# Patient Record
Sex: Female | Born: 1990 | Race: Black or African American | Hispanic: No | Smoking: Never smoker
Health system: Southern US, Community
[De-identification: ages and names within clinical notes are randomized; demographics above are authoritative.]

## PROBLEM LIST (undated history)

## (undated) ENCOUNTER — Inpatient Hospital Stay (HOSPITAL_COMMUNITY): Payer: Self-pay

## (undated) DIAGNOSIS — R0602 Shortness of breath: Secondary | ICD-10-CM

## (undated) DIAGNOSIS — Q513 Bicornate uterus: Secondary | ICD-10-CM

## (undated) DIAGNOSIS — R002 Palpitations: Secondary | ICD-10-CM

## (undated) DIAGNOSIS — N12 Tubulo-interstitial nephritis, not specified as acute or chronic: Secondary | ICD-10-CM

## (undated) DIAGNOSIS — Z87898 Personal history of other specified conditions: Secondary | ICD-10-CM

## (undated) HISTORY — DX: Palpitations: R00.2

## (undated) HISTORY — DX: Personal history of other specified conditions: Z87.898

## (undated) HISTORY — DX: Bicornate uterus: Q51.3

## (undated) HISTORY — PX: NO PAST SURGERIES: SHX2092

## (undated) HISTORY — DX: Shortness of breath: R06.02

---
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
## 2012-06-16 ENCOUNTER — Ambulatory Visit (INDEPENDENT_AMBULATORY_CARE_PROVIDER_SITE_OTHER): Payer: Managed Care, Other (non HMO) | Admitting: Women's Health

## 2012-06-16 ENCOUNTER — Other Ambulatory Visit (HOSPITAL_COMMUNITY)
Admission: RE | Admit: 2012-06-16 | Discharge: 2012-06-16 | Disposition: A | Discharge status: Home / Self Care | Payer: Managed Care, Other (non HMO) | Source: Ambulatory Visit | Attending: Women's Health | Admitting: Women's Health

## 2012-06-16 ENCOUNTER — Encounter: Payer: Self-pay | Admitting: Women's Health

## 2012-06-16 VITALS — BP 96/60 | Ht 68.0 in | Wt 133.0 lb

## 2012-06-16 DIAGNOSIS — Z01419 Encounter for gynecological examination (general) (routine) without abnormal findings: Secondary | ICD-10-CM

## 2012-06-16 DIAGNOSIS — Z309 Encounter for contraceptive management, unspecified: Secondary | ICD-10-CM

## 2012-06-16 DIAGNOSIS — IMO0001 Reserved for inherently not codable concepts without codable children: Secondary | ICD-10-CM

## 2012-06-16 DIAGNOSIS — Z113 Encounter for screening for infections with a predominantly sexual mode of transmission: Secondary | ICD-10-CM

## 2012-06-16 DIAGNOSIS — R87810 Cervical high risk human papillomavirus (HPV) DNA test positive: Secondary | ICD-10-CM | POA: Insufficient documentation

## 2012-06-16 DIAGNOSIS — Z23 Encounter for immunization: Secondary | ICD-10-CM

## 2012-06-16 DIAGNOSIS — Z1151 Encounter for screening for human papillomavirus (HPV): Secondary | ICD-10-CM | POA: Insufficient documentation

## 2012-06-16 LAB — CBC WITH DIFFERENTIAL/PLATELET
Basophils Relative: 0 % (ref 0–1)
Eosinophils Absolute: 0.1 10*3/uL (ref 0.0–0.7)
Eosinophils Relative: 1 % (ref 0–5)
Hemoglobin: 12.7 g/dL (ref 12.0–15.0)
Lymphs Abs: 1.8 10*3/uL (ref 0.7–4.0)
MCH: 24 pg — ABNORMAL LOW (ref 26.0–34.0)
MCHC: 32.5 g/dL (ref 30.0–36.0)
MCV: 73.9 fL — ABNORMAL LOW (ref 78.0–100.0)
Monocytes Relative: 7 % (ref 3–12)
RBC: 5.29 MIL/uL — ABNORMAL HIGH (ref 3.87–5.11)

## 2012-06-16 MED ORDER — LEVONORGEST-ETH ESTRAD 91-DAY 0.15-0.03 &0.01 MG PO TABS: 1.0000 | ORAL_TABLET | Freq: Every day | ORAL | Status: DC

## 2012-06-16 NOTE — Progress Notes (Signed)
Melody Mcclure [birth year] [id number]    History:    The patient presents for annual exam. Contraceptives on Seasonique without complaint. New partner in past year, history of a negative STD screen. Without GI, urinary, vaginal discharge complaints.  Received 1 Gardasil years ago, restart Gardasil series today.  Past medical history, past surgical history, family history and social history were all reviewed and documented in the EPIC chart. 10 lb weight gain in last year. 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 Family Hx includes HTN MA. Social drinking, denies tobacco or illicit drug use.   ROS:  A  ROS was performed and pertinent positives and negatives are included in the history.   Exam:  Filed Vitals:   06/16/12 0956  BP: 96/60    General appearance:  Normal Head/Neck:  Normal, without cervical or supraclavicular adenopathy. Thyroid:  Symmetrical, normal in size, without palpable masses or nodularity. Respiratory  Effort:  Normal  Auscultation:  Clear without wheezing or rhonchi Cardiovascular  Auscultation:  Regular rate, without rubs, murmurs or gallops  Edema/varicosities:  Not grossly evident Abdominal  Soft,nontender, without masses, guarding or rebound.  Liver/spleen:  No organomegaly noted  Hernia:  None appreciated  Skin  Inspection:  Grossly normal  Palpation:  Grossly normal Neurologic/psychiatric  Orientation:  Normal with appropriate conversation.  Mood/affect:  Normal  Genitourinary    Breasts: Examined lying and sitting.     Right: Without masses, retractions, discharge or axillary adenopathy.     Left: Without masses, retractions, discharge or axillary adenopathy.   Inguinal/mons:  Normal without inguinal adenopathy  External genitalia:  Normal  BUS/Urethra/Skene's glands:  Normal  Bladder:  Normal  Vagina:  Normal  Cervix:  Normal  Uterus:   normal in size, shape and contour.  Midline and mobile  Adnexa/parametria:      Rt: Without masses or tenderness.   Lt: Without masses or tenderness.  Anus and perineum: Normal    Assessment/Plan:  22 y.o. SBF G0  for annual exam without complaint.  Normal GYN exam on Seasonique STD screen  Plan: Prescription Seasonique, discussed risk of blood clots and stroke. Encouraged use of condoms until permanent partner. CBC, GC/Chlamydia, PAP,  review of new guidelines. Discussed importance of SBE's, healthy diet, exercise and daily multivitamin. Driving and campus safety reviewed. Gardisal series restarted, instructed to return in March and August to complete series. Will have HIV, hepatitis and RPR checked at student health.    Harrington Challenger [facility], 10:41 AM 06/16/2012

## 2012-06-16 NOTE — Addendum Note (Signed)
Addended by: Richardson Chiquito on: 06/16/2012 11:21 AM   Modules accepted: Orders

## 2012-06-16 NOTE — Patient Instructions (Signed)
Health Maintenance, 22- to 22-Year-Old SCHOOL PERFORMANCE After high school completion, the Libby Goehring adult may be attending college, technical or vocational school, or entering the military or the work force. SOCIAL AND EMOTIONAL DEVELOPMENT The Rosette Bellavance adult establishes adult relationships and explores sexual identity. Evalee Gerard adults may be living at home or in a college dorm or apartment. Increasing independence is important with Majed Pellegrin adults. Throughout adolescence, teens should assume responsibility of their own health care. IMMUNIZATIONS Most Nehal Shives adults should be fully vaccinated. A booster dose of Tdap (tetanus, diphtheria, and pertussis, or "whooping cough"), a dose of meningococcal vaccine to protect against a certain type of bacterial meningitis, hepatitis A, human papillomarvirus (HPV), chickenpox, or measles vaccines may be indicated, if not given at an earlier age. Annual influenza or "flu" vaccination should be considered during flu season.   TESTING Annual screening for vision and hearing problems is recommended. Vision should be screened objectively at least once between 22 and 22 years of age. The Zamani Crocker adult may be screened for anemia or tuberculosis. Ferrel Simington adults should have a blood test to check for high cholesterol during this time period. Jhaniya Briski adults should be screened for use of alcohol and drugs. If the Lynleigh Kovack adult is sexually active, screening for sexually transmitted infections, pregnancy, or HIV may be performed. Screening for cervical cancer should be performed within 3 years of beginning sexual activity. NUTRITION AND ORAL HEALTH  Adequate calcium intake is important. Consume 3 servings of low-fat milk and dairy products daily. For those who do not drink milk or consume dairy products, calcium enriched foods, such as juice, bread, or cereal, dark, leafy greens, or canned fish are alternate sources of calcium.   Drink plenty of water. Limit fruit juice to 8 to 12 ounces per day.  Avoid sugary beverages or sodas.   Discourage skipping meals, especially breakfast. Teens should eat a good variety of vegetables and fruits, as well as lean meats.   Avoid high fat, high salt, and high sugar foods, such as candy, chips, and cookies.   Encourage Mathan Darroch adults to participate in meal planning and preparation.   Eat meals together as a family whenever possible. Encourage conversation at mealtime.   Limit fast food choices and eating out at restaurants.   Brush teeth twice a day and floss.   Schedule dental exams twice a year.  SLEEP Regular sleep habits are important. PHYSICAL, SOCIAL, AND EMOTIONAL DEVELOPMENT  One hour of regular physical activity daily is recommended. Continue to participate in sports.   Encourage Zyah Gomm adults to develop their own interests and consider community service or volunteerism.   Provide guidance to the Vamsi Apfel adult in making decisions about college and work plans.   Make sure that Kourtney Terriquez adults know that they should never be in a situation that makes them uncomfortable, and they should tell partners if they do not want to engage in sexual activity.   Talk to the Maryem Shuffler adult about body image. Eating disorders may be noted at this time. Fran Mcree adults may also be concerned about being overweight. Monitor the Deryck Hippler adult for weight gain or loss.   Mood disturbances, depression, anxiety, alcoholism, or attention problems may be noted in Jamilex Bohnsack adults. Talk to the caregiver if there are concerns about mental illness.   Negotiate limit setting and independent decision making.   Encourage the Lenell Lama adult to handle conflict without physical violence.   Avoid loud noises which may impair hearing.   Limit television and computer time to 2 hours per   day. Individuals who engage in excessive sedentary activity are more likely to become overweight.  RISK BEHAVIORS  Sexually active Phoenix Dresser adults need to take precautions against pregnancy and sexually  transmitted infections. Talk to Ciel Chervenak adults about contraception.   Provide a tobacco-free and drug-free environment for the Cela Newcom adult. Talk to the Inocente Krach adult about drug, tobacco, and alcohol use among friends or at friends' homes. Make sure the Aluna Whiston adult knows that smoking tobacco or marijuana and taking drugs have health consequences and may impact brain development.   Teach the Lillien Petronio adult about appropriate use of over-the-counter or prescription medicines.   Establish guidelines for driving and for riding with friends.   Talk to Gregori Abril adults about the risks of drinking and driving or boating. Encourage the Jyllian Haynie adult to call you if he or she or friends have been drinking or using drugs.   Remind Chett Taniguchi adults to wear seat belts at all times in cars and life vests in boats.   Dyllan Kats adults should always wear a properly fitted helmet when they are riding a bicycle.   Use caution with all-terrain vehicles (ATVs) or other motorized vehicles.   Do not keep handguns in the home. (If you do, the gun and ammunition should be locked separately and out of the Shary Lamos adult's access.)   Equip your home with smoke detectors and change the batteries regularly. Make sure all family members know the fire escape plans for your home.   Teach Aahana Elza adults not to swim alone and not to dive in shallow water.   All individuals should wear sunscreen that protects against UVA and UVB light with at least a sun protection factor (SPF) of 30 when out in the sun. This minimizes sun burning.  WHAT'S NEXT? Gaelan Glennon adults should visit their pediatrician or family physician yearly. By Bethann Qualley adulthood, health care should be transitioned to a family physician or internal medicine specialist. Sexually active females may want to begin annual physical exams with a gynecologist. Document Released: 07/23/2006 Document Revised: 07/20/2011 Document Reviewed: 08/12/2006 ExitCare Patient Information 2013 [employer].    

## 2012-06-17 LAB — GC/CHLAMYDIA PROBE AMP
CT Probe RNA: NEGATIVE
GC Probe RNA: NEGATIVE

## 2012-08-23 ENCOUNTER — Ambulatory Visit: Payer: Managed Care, Other (non HMO)

## 2013-06-19 ENCOUNTER — Encounter: Payer: Managed Care, Other (non HMO) | Admitting: Women's Health

## 2013-06-27 ENCOUNTER — Encounter: Payer: Self-pay | Admitting: Women's Health

## 2013-08-04 ENCOUNTER — Ambulatory Visit (INDEPENDENT_AMBULATORY_CARE_PROVIDER_SITE_OTHER): Payer: BC Managed Care – PPO | Admitting: Cardiology

## 2013-08-04 ENCOUNTER — Encounter: Payer: Self-pay | Admitting: *Deleted

## 2013-08-04 VITALS — BP 124/66 | HR 80 | Ht 68.0 in | Wt 133.0 lb

## 2013-08-04 DIAGNOSIS — R55 Syncope and collapse: Secondary | ICD-10-CM

## 2013-08-04 DIAGNOSIS — R0989 Other specified symptoms and signs involving the circulatory and respiratory systems: Secondary | ICD-10-CM

## 2013-08-04 DIAGNOSIS — R0609 Other forms of dyspnea: Secondary | ICD-10-CM

## 2013-08-04 DIAGNOSIS — R002 Palpitations: Secondary | ICD-10-CM

## 2013-08-04 DIAGNOSIS — R0600 Dyspnea, unspecified: Secondary | ICD-10-CM

## 2013-08-04 NOTE — Patient Instructions (Addendum)
Your physician recommends that you continue on your current medications as directed. Please refer to the Current Medication list given to you today.  Your physician has requested that you have an echocardiogram. Echocardiography is a painless test that uses sound waves to create images of your heart. It provides your doctor with information about the size and shape of your heart and how well your heart's chambers and valves are working. This procedure takes approximately one hour. There are no restrictions for this procedure.  Your physician has recommended that you wear a 24 holter monitor. Holter monitors are medical devices that record the heart's electrical activity. Doctors most often use these monitors to diagnose arrhythmias. Arrhythmias are problems with the speed or rhythm of the heartbeat. The monitor is a small, portable device. You can wear one while you do your normal daily activities. This is usually used to diagnose what is causing palpitations/syncope (passing out).  Your physician recommends that you schedule a follow-up as needed.  Vasovagal Syncope, Adult Syncope, commonly known as fainting, is a temporary loss of consciousness. It occurs when the blood flow to the brain is reduced. Vasovagal syncope (also called neurocardiogenic syncope) is a fainting spell in which the blood flow to the brain is reduced because of a sudden drop in heart rate and blood pressure. Vasovagal syncope occurs when the brain and the cardiovascular system (blood vessels) do not adequately communicate and respond to each other. This is the most common cause of fainting. It often occurs in response to fear or some other type of emotional or physical stress. The body has a reaction in which the heart starts beating too slowly or the blood vessels expand, reducing blood pressure. This type of fainting spell is generally considered harmless. However, injuries can occur if a person takes a sudden fall during a  fainting spell.  CAUSES  Vasovagal syncope occurs when a person's blood pressure and heart rate decrease suddenly, usually in response to a trigger. Many things and situations can trigger an episode. Some of these include:   Pain.   Fear.   The sight of blood or medical procedures, such as blood being drawn from a vein.   Common activities, such as coughing, swallowing, stretching, or going to the bathroom.   Emotional stress.   Prolonged standing, especially in a warm environment.   Lack of sleep or rest.   Prolonged lack of food.   Prolonged lack of fluids.   Recent illness.  The use of certain drugs that affect blood pressure, such as cocaine, alcohol, marijuana, inhalants, and opiates.  SYMPTOMS  Before the fainting episode, you may:   Feel dizzy or light headed.   Become pale.  Sense that you are going to faint.   Feel like the room is spinning.   Have tunnel vision, only seeing directly in front of you.   Feel sick to your stomach (nauseous).   See spots or slowly lose vision.   Hear ringing in your ears.   Have a headache.   Feel warm and sweaty.   Feel a sensation of pins and needles. During the fainting spell, you will generally be unconscious for no longer than a couple minutes before waking up and returning to normal. If you get up too quickly before your body can recover, you may faint again. Some twitching or jerky movements may occur during the fainting spell.  DIAGNOSIS  Your caregiver will ask about your symptoms, take a medical history, and perform a physical  exam. Various tests may be done to rule out other causes of fainting. These may include blood tests and tests to check the heart, such as electrocardiography, echocardiography, and possibly an electrophysiology study. When other causes have been ruled out, a test may be done to check the body's response to changes in position (tilt table test). TREATMENT  Most cases of  vasovagal syncope do not require treatment. Your caregiver may recommend ways to avoid fainting triggers and may provide home strategies for preventing fainting. If you must be exposed to a possible trigger, you can drink additional fluids to help reduce your chances of having an episode of vasovagal syncope. If you have warning signs of an oncoming episode, you can respond by positioning yourself favorably (lying down). If your fainting spells continue, you may be given medicines to prevent fainting. Some medicines may help make you more resistant to repeated episodes of vasovagal syncope. Special exercises or compression stockings may be recommended. In rare cases, the surgical placement of a pacemaker is considered. HOME CARE INSTRUCTIONS   Learn to identify the warning signs of vasovagal syncope.   Sit or lie down at the first warning sign of a fainting spell. If sitting, put your head down between your legs. If you lie down, swing your legs up in the air to increase blood flow to the brain.   Avoid hot tubs and saunas.  Avoid prolonged standing.  Drink enough fluids to keep your urine clear or pale yellow. Avoid caffeine.  Increase salt in your diet as directed by your caregiver.   If you have to stand for a long time, perform movements such as:   Crossing your legs.   Flexing and stretching your leg muscles.   Squatting.   Moving your legs.   Bending over.   Only take over-the-counter or prescription medicines as directed by your caregiver. Do not suddenly stop any medicines without asking your caregiver first. SEEK MEDICAL CARE IF:   Your fainting spells continue or happen more frequently in spite of treatment.   You lose consciousness for more than a couple minutes.  You have fainting spells during or after exercising or after being startled.   You have new symptoms that occur with the fainting spells, such as:   Shortness of breath.  Chest pain.    Irregular heartbeat.   You have episodes of twitching or jerky movements that last longer than a few seconds.  You have episodes of twitching or jerky movements without obvious fainting. SEEK IMMEDIATE MEDICAL CARE IF:   You have injuries or bleeding after a fainting spell.   You have episodes of twitching or jerky movements that last longer than 5 minutes.   You have more than one spell of twitching or jerky movements before returning to consciousness after fainting. MAKE SURE YOU:   Understand these instructions.  Will watch your condition.  Will get help right away if you are not doing well or get worse. Document Released: 04/13/2012 Document Reviewed: 04/13/2012 [facility] Patient Information 2014 [place], [place].

## 2013-08-04 NOTE — Progress Notes (Signed)
[address] [place]  [id number] Phone: [phone] Fax:  [phone]  Date:  08/04/2013   ID:  Melody Mcclure, DOB November 22, 1990, MRN [id number]  PCP:  Default, Provider, MD   History of Present Illness: Melody Kidlesha Marczak is a 23 y.o. female Here for evaluation of palpitations, dyspnea and syncope. She was recently seen by Dr. Hyman HopesWebb on 07/17/13 for a new visit and she described a sensation like her heart is going to raise out of her chest. She felt shaky when this happened. No significant anxiety. She does remember that in high school she had episodes of syncope at times. Her last episode was approximately one year ago. This occurred when she was at a bar. She was not drinking or using any drugs. She passed out approximately 3 times. She felt too tired to chew gum. Blood pressure at that visit was 109/69 with a pulse of 101. Blood work was performed including hemoglobin was 12.6, TSH was 0.86, normal, creatinine 0.9, potassium 3.8, liver functions normal. EKG showed sinus rhythm, possible left atrial enlargement.  Feels pounding at times during the day. She feels her heart racing at times. May feel some slight shortness of breath with this.    Wt Readings from Last 3 Encounters:  08/04/13 133 lb (60.328 kg)  06/16/12 133 lb (60.328 kg)     Past Medical History  Diagnosis Date  . History of syncope     last episode 2014  . Palpitations     Past Surgical History  Procedure Laterality Date  . No past surgeries      No current outpatient prescriptions on file.   No current facility-administered medications for this visit.    Allergies:   No Known Allergies  Social History:  The patient  reports that she has never smoked. She has never used smokeless tobacco. She reports that she drinks alcohol. She reports that she does not use illicit drugs. student at [employer]  Family History  Problem Relation Age of Onset  . Asthma Brother   No early family history  of cardiovascular disease, sudden death  ROS:  Please see the history of present illness.   Denies any vomiting, chest pain, headache, diarrhea, blood loss, fevers, chills, rashes.   All other systems reviewed and negative.   PHYSICAL EXAM: VS:  BP 124/66  Pulse 80  Ht 5\' 8"  (1.727 m)  Wt 133 lb (60.328 kg)  BMI 20.23 kg/m2 Well nourished, well developed, in no acute distress HEENT: normal, [place]/AT, EOMI Neck: no JVD, normal carotid upstroke, no bruit Cardiac:  normal S1, S2; RRR; no murmur Lungs:  clear to auscultation bilaterally, no wheezing, rhonchi or rales Abd: soft, nontender, no hepatomegaly, no bruits Ext: no edema, 2+ distal pulses Skin: warm and dry GU: deferred Neuro: no focal abnormalities noted, AAO x 3  EKG:  07/17/13-sinus rhythm, possible left atrial enlargement, no ST segment changes, no abnormal intervals.  ASSESSMENT AND PLAN:  1. Syncope-she has not had an episode in a few months. These sound like vasovagal episodes. Continue to maintain hydration. Liberalize salt if necessary. She does not have any palpitations around these episodes. Diaphoresis. Clamminess. She has a classic prodrome.I will check an echocardiogram to ensure that she does not have any abnormalities. Luckly, usually this dissipates with age. 2. Palpitations-the "thump" sensation likely PVCs or PACs. Occasionally however she will have a racing sensation. She may feel shortness of breath at times. I will check  a 24-hour Holter monitor to ensure that she does not have any adverse arrhythmias. She's not interested in medications and this is fine. 3. We'll followup with monitor and echocardiogram. She can call me if symptoms worsen.  Signed, Donato Schultz, MD [facility]  08/04/2013 2:04 PM

## 2013-08-09 ENCOUNTER — Ambulatory Visit (HOSPITAL_COMMUNITY): Payer: Managed Care, Other (non HMO) | Attending: Cardiology | Admitting: Radiology

## 2013-08-09 ENCOUNTER — Encounter: Payer: Self-pay | Admitting: *Deleted

## 2013-08-09 ENCOUNTER — Encounter (INDEPENDENT_AMBULATORY_CARE_PROVIDER_SITE_OTHER): Payer: BC Managed Care – PPO

## 2013-08-09 DIAGNOSIS — R55 Syncope and collapse: Secondary | ICD-10-CM | POA: Insufficient documentation

## 2013-08-09 DIAGNOSIS — R002 Palpitations: Secondary | ICD-10-CM

## 2013-08-09 NOTE — Progress Notes (Signed)
Echocardiogram performed.  

## 2013-08-09 NOTE — Progress Notes (Signed)
Patient ID: Melody Mcclure, female   DOB: [birth year], 23 y.o.   MRN: [id number] E-Cardio 24 hour holter monitor applied to patient.

## 2013-08-18 ENCOUNTER — Other Ambulatory Visit (HOSPITAL_COMMUNITY)
Admission: RE | Admit: 2013-08-18 | Discharge: 2013-08-18 | Disposition: A | Discharge status: Home / Self Care | Payer: BC Managed Care – PPO | Source: Ambulatory Visit | Attending: Gynecology | Admitting: Gynecology

## 2013-08-18 ENCOUNTER — Ambulatory Visit (INDEPENDENT_AMBULATORY_CARE_PROVIDER_SITE_OTHER): Payer: Managed Care, Other (non HMO) | Admitting: Women's Health

## 2013-08-18 ENCOUNTER — Encounter: Payer: Self-pay | Admitting: Women's Health

## 2013-08-18 VITALS — BP 110/70 | Ht 67.0 in | Wt 127.0 lb

## 2013-08-18 DIAGNOSIS — Z309 Encounter for contraceptive management, unspecified: Secondary | ICD-10-CM

## 2013-08-18 DIAGNOSIS — Z23 Encounter for immunization: Secondary | ICD-10-CM

## 2013-08-18 DIAGNOSIS — IMO0001 Reserved for inherently not codable concepts without codable children: Secondary | ICD-10-CM

## 2013-08-18 DIAGNOSIS — Z01419 Encounter for gynecological examination (general) (routine) without abnormal findings: Secondary | ICD-10-CM | POA: Insufficient documentation

## 2013-08-18 LAB — CBC WITH DIFFERENTIAL/PLATELET
Basophils Absolute: 0 10*3/uL (ref 0.0–0.1)
Basophils Relative: 0 % (ref 0–1)
EOS ABS: 0.1 10*3/uL (ref 0.0–0.7)
Eosinophils Relative: 2 % (ref 0–5)
HEMATOCRIT: 39.2 % (ref 36.0–46.0)
HEMOGLOBIN: 13.1 g/dL (ref 12.0–15.0)
LYMPHS ABS: 1.6 10*3/uL (ref 0.7–4.0)
LYMPHS PCT: 37 % (ref 12–46)
MCH: 23.9 pg — ABNORMAL LOW (ref 26.0–34.0)
MCHC: 33.4 g/dL (ref 30.0–36.0)
MCV: 71.5 fL — AB (ref 78.0–100.0)
MONO ABS: 0.3 10*3/uL (ref 0.1–1.0)
Monocytes Relative: 6 % (ref 3–12)
Neutro Abs: 2.4 10*3/uL (ref 1.7–7.7)
Neutrophils Relative %: 55 % (ref 43–77)
Platelets: 287 10*3/uL (ref 150–400)
RBC: 5.48 MIL/uL — AB (ref 3.87–5.11)
RDW: 15.3 % (ref 11.5–15.5)
WBC: 4.3 10*3/uL (ref 4.0–10.5)

## 2013-08-18 MED ORDER — NORGESTIMATE-ETH ESTRADIOL 0.25-35 MG-MCG PO TABS: ORAL_TABLET | ORAL | Status: DC

## 2013-08-18 NOTE — Patient Instructions (Signed)

## 2013-08-18 NOTE — Progress Notes (Signed)
Melody Mcclure 12/16/1990 [id number]    History:    Presents for annual exam.  Cycles every 3 months on Seasonique ran out of RX,, has not been sexually active since. Has had one gardasil.  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  Past medical history, past surgical history, family history and social history were all reviewed and documented in the EPIC chart. Senior at [employer], psychology major.  ROS:  A  ROS was performed and pertinent positives and negatives are included.  Exam:  Filed Vitals:   08/18/13 1135  BP: 110/70    General appearance:  Normal Thyroid:  Symmetrical, normal in size, without palpable masses or nodularity. Respiratory  Auscultation:  Clear without wheezing or rhonchi Cardiovascular  Auscultation:  Regular rate, without rubs, murmurs or gallops  Edema/varicosities:  Not grossly evident Abdominal  Soft,nontender, without masses, guarding or rebound.  Liver/spleen:  No organomegaly noted  Hernia:  None appreciated  Skin  Inspection:  Grossly normal   Breasts: Examined lying and sitting.     Right: Without masses, retractions, discharge or axillary adenopathy.     Left: Without masses, retractions, discharge or axillary adenopathy. Gentitourinary   Inguinal/mons:  Normal without inguinal adenopathy  External genitalia:  Normal  BUS/Urethra/Skene's glands:  Normal  Vagina:  Normal  Cervix:  Normal  Uterus:   normal in size, shape and contour.  Midline and mobile  Adnexa/parametria:     Rt: Without masses or tenderness.   Lt: Without masses or tenderness.  Anus and perineum: Normal    Assessment/Plan:  23 y.o. SBF G0 for annual exam with complaint of increased acne since stopping OC's.  Ascus with positive HR HPV 2014 Need to complete gardasil series  Plan: Second gardasil given reviewed importance of followup  to complete series. SBE's, regular exercise, calcium rich diet, MVI daily encouraged. Contraception options reviewed will try Sprintec  prescription, proper use given and reviewed will take continuously for 3 months. Condoms encouraged if become sexually active. Dating/driving/campus safety reviewed. CBC, UA, Pap.   Harrington Challengerancy J Young [facility], 12:41 PM 08/18/2013

## 2013-08-19 LAB — URINALYSIS W MICROSCOPIC + REFLEX CULTURE
BACTERIA UA: NONE SEEN
Bilirubin Urine: NEGATIVE
CASTS: NONE SEEN
CRYSTALS: NONE SEEN
Glucose, UA: NEGATIVE mg/dL
Hgb urine dipstick: NEGATIVE
Ketones, ur: NEGATIVE mg/dL
Leukocytes, UA: NEGATIVE
NITRITE: NEGATIVE
PH: 7 (ref 5.0–8.0)
Protein, ur: NEGATIVE mg/dL
SPECIFIC GRAVITY, URINE: 1.018 (ref 1.005–1.030)
Urobilinogen, UA: 0.2 mg/dL (ref 0.0–1.0)

## 2013-09-05 ENCOUNTER — Telehealth: Payer: Self-pay | Admitting: Cardiology

## 2013-09-05 NOTE — Telephone Encounter (Signed)
Monitor results. 

## 2014-03-06 ENCOUNTER — Encounter: Payer: Self-pay | Admitting: Women's Health

## 2014-03-06 ENCOUNTER — Ambulatory Visit (INDEPENDENT_AMBULATORY_CARE_PROVIDER_SITE_OTHER): Payer: Managed Care, Other (non HMO) | Admitting: Women's Health

## 2014-03-06 VITALS — BP 120/80 | Ht 67.0 in | Wt 126.0 lb

## 2014-03-06 DIAGNOSIS — Z113 Encounter for screening for infections with a predominantly sexual mode of transmission: Secondary | ICD-10-CM

## 2014-03-06 DIAGNOSIS — B3731 Acute candidiasis of vulva and vagina: Secondary | ICD-10-CM

## 2014-03-06 DIAGNOSIS — R319 Hematuria, unspecified: Secondary | ICD-10-CM

## 2014-03-06 DIAGNOSIS — Z23 Encounter for immunization: Secondary | ICD-10-CM

## 2014-03-06 DIAGNOSIS — B373 Candidiasis of vulva and vagina: Secondary | ICD-10-CM

## 2014-03-06 DIAGNOSIS — N3001 Acute cystitis with hematuria: Secondary | ICD-10-CM

## 2014-03-06 LAB — WET PREP FOR TRICH, YEAST, CLUE
Clue Cells Wet Prep HPF POC: NONE SEEN
Trich, Wet Prep: NONE SEEN

## 2014-03-06 LAB — URINALYSIS W MICROSCOPIC + REFLEX CULTURE
Bilirubin Urine: NEGATIVE
CASTS: NONE SEEN
Crystals: NONE SEEN
GLUCOSE, UA: NEGATIVE mg/dL
KETONES UR: NEGATIVE mg/dL
NITRITE: NEGATIVE
PH: 8 (ref 5.0–8.0)
PROTEIN: 100 mg/dL — AB
Specific Gravity, Urine: 1.02 (ref 1.005–1.030)
Urobilinogen, UA: 0.2 mg/dL (ref 0.0–1.0)

## 2014-03-06 MED ORDER — SULFAMETHOXAZOLE-TRIMETHOPRIM 800-160 MG PO TABS: 1.0000 | ORAL_TABLET | Freq: Two times a day (BID) | ORAL | Status: DC

## 2014-03-06 MED ORDER — FLUCONAZOLE 150 MG PO TABS
ORAL_TABLET | ORAL | Status: DC
Start: 2014-03-06 — End: 2014-08-22

## 2014-03-06 NOTE — Patient Instructions (Signed)

## 2014-03-06 NOTE — Progress Notes (Signed)
Presents for urinary frequency and hematuria for one week. Denies fever, abdominal/lower back pain, burning or painful urination, but has "tingling" feeling. Denies vaginal discharge/odor. Sprintec takes continuously with rare periods. New partner within last month.  Wants an STD screen. Inconsistent condom use.   Exam: External genitalia WNLs. Speculum exam cervix is pink, scant white discharge, slight erythema. Bimanual no CMT or adnexal fullness or tenderness. No CVA tenderness. Wet prep positive for many yeast UA Large blood, moderate Leukocytes, WBC TNTC, RBC TNTC, many bacteria  UTI with hematuria Candida Vulvovaginitis  Plan: Bactrim 800-160 PO BID X3 days. Diflucan 150 PO once today, once after completing Bactrim. Counseled about UTI prevention and not to use hot tubs. Reviewed importance of condom use.

## 2014-03-07 LAB — GC/CHLAMYDIA PROBE AMP
CT Probe RNA: NEGATIVE
GC Probe RNA: NEGATIVE

## 2014-03-07 NOTE — Addendum Note (Signed)
Addended by: Kem ParkinsonBARNES, Indi Willhite on: 03/07/2014 08:29 AM   Modules accepted: Orders

## 2014-03-09 LAB — URINE CULTURE: Colony Count: 95000

## 2014-08-02 ENCOUNTER — Other Ambulatory Visit: Payer: Self-pay | Admitting: *Deleted

## 2014-08-02 DIAGNOSIS — Z30011 Encounter for initial prescription of contraceptive pills: Secondary | ICD-10-CM

## 2014-08-02 MED ORDER — NORGESTIMATE-ETH ESTRADIOL 0.25-35 MG-MCG PO TABS: ORAL_TABLET | ORAL | Status: DC

## 2014-08-08 ENCOUNTER — Telehealth: Payer: Self-pay | Admitting: *Deleted

## 2014-08-08 DIAGNOSIS — Z30011 Encounter for initial prescription of contraceptive pills: Secondary | ICD-10-CM

## 2014-08-08 MED ORDER — NORGESTIMATE-ETH ESTRADIOL 0.25-35 MG-MCG PO TABS: ORAL_TABLET | ORAL | Status: DC

## 2014-08-08 NOTE — Telephone Encounter (Signed)
Pt called requesting that birth control pills be sent to local pharmacy. Rx sent

## 2014-08-22 ENCOUNTER — Ambulatory Visit (INDEPENDENT_AMBULATORY_CARE_PROVIDER_SITE_OTHER): Payer: BLUE CROSS/BLUE SHIELD | Admitting: Women's Health

## 2014-08-22 ENCOUNTER — Encounter: Payer: Self-pay | Admitting: Women's Health

## 2014-08-22 VITALS — BP 118/80 | Ht 67.0 in | Wt 124.0 lb

## 2014-08-22 DIAGNOSIS — Z30011 Encounter for initial prescription of contraceptive pills: Secondary | ICD-10-CM | POA: Diagnosis not present

## 2014-08-22 DIAGNOSIS — Z01419 Encounter for gynecological examination (general) (routine) without abnormal findings: Secondary | ICD-10-CM | POA: Diagnosis not present

## 2014-08-22 LAB — CBC WITH DIFFERENTIAL/PLATELET
BASOS ABS: 0 10*3/uL (ref 0.0–0.1)
BASOS PCT: 1 % (ref 0–1)
EOS ABS: 0 10*3/uL (ref 0.0–0.7)
Eosinophils Relative: 1 % (ref 0–5)
HEMATOCRIT: 38.1 % (ref 36.0–46.0)
Hemoglobin: 12.3 g/dL (ref 12.0–15.0)
Lymphocytes Relative: 37 % (ref 12–46)
Lymphs Abs: 1.5 10*3/uL (ref 0.7–4.0)
MCH: 23.2 pg — ABNORMAL LOW (ref 26.0–34.0)
MCHC: 32.3 g/dL (ref 30.0–36.0)
MCV: 71.8 fL — ABNORMAL LOW (ref 78.0–100.0)
MONO ABS: 0.2 10*3/uL (ref 0.1–1.0)
MONOS PCT: 6 % (ref 3–12)
MPV: 10.9 fL (ref 8.6–12.4)
NEUTROS ABS: 2.2 10*3/uL (ref 1.7–7.7)
Neutrophils Relative %: 55 % (ref 43–77)
PLATELETS: 275 10*3/uL (ref 150–400)
RBC: 5.31 MIL/uL — AB (ref 3.87–5.11)
RDW: 15 % (ref 11.5–15.5)
WBC: 4 10*3/uL (ref 4.0–10.5)

## 2014-08-22 MED ORDER — NORGESTIMATE-ETH ESTRADIOL 0.25-35 MG-MCG PO TABS: ORAL_TABLET | ORAL | Status: DC

## 2014-08-22 NOTE — Progress Notes (Signed)
Melody Mcclure May 02, 1991 [id number]    History:    Presents for annual exam.  Monthly cycle/OCs. Reports hasn't been taking OCs regularly due to cost. 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 Gardasil series complete.   Past medical history, past surgical history, family history and social history were all reviewed and documented in the EPIC chart. 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  ROS:  A ROS was performed and pertinent positives and negatives are included.  Exam:  Filed Vitals:   08/22/14 0843  BP: 118/80    General appearance:  Normal Thyroid:  Symmetrical, normal in size, without palpable masses or nodularity. Respiratory  Auscultation:  Clear without wheezing or rhonchi Cardiovascular  Auscultation:  Regular rate, without rubs, murmurs or gallops  Edema/varicosities:  Not grossly evident Abdominal  Soft,nontender, without masses, guarding or rebound.  Liver/spleen:  No organomegaly noted  Hernia:  None appreciated  Skin  Inspection:  Grossly normal   Breasts: Examined lying and sitting/bilateral piercing    Right: Without masses, retractions, discharge or axillary adenopathy.     Left: Without masses, retractions, discharge or axillary adenopathy. Gentitourinary   Inguinal/mons:  Normal without inguinal adenopathy  External genitalia:  Normal  BUS/Urethra/Skene's glands:  Normal  Vagina:  Normal  Cervix:  Normal  Uterus:  Normal in size, shape and contour.  Midline and mobile  Adnexa/parametria:     Rt: Without masses or tenderness.   Lt: Without masses or tenderness.  Anus and perineum: Normal  Assessment/Plan:  24 y.o. SBF G0 for annual exam.  ASCUS with positive HR HPV 2014, normal Paps since Contraception management  Plan:  Contraception reviewed, Continue with Sprintec, prescription given,  risks of blood clots and stroke reviewed, proper use discussed, condoms encouraged until permanent partner. Denies need for STD  screen . SBEs, regular exercise, MVI, calcium rich diet,  Reviewed campus and dating safety. CBC, UA. New Pap screening guidelines reviewed.     Harrington ChallengerYOUNG,Elienai Gailey J May Street [facility] Center LLCWHNP, 9:21 AM 08/22/2014

## 2014-08-22 NOTE — Patient Instructions (Signed)

## 2014-08-23 LAB — URINALYSIS, ROUTINE W REFLEX MICROSCOPIC
Bilirubin Urine: NEGATIVE
Glucose, UA: NEGATIVE mg/dL
Hgb urine dipstick: NEGATIVE
Ketones, ur: NEGATIVE mg/dL
Leukocytes, UA: NEGATIVE
Nitrite: NEGATIVE
Protein, ur: NEGATIVE mg/dL
Specific Gravity, Urine: 1.015 (ref 1.005–1.030)
Urobilinogen, UA: 0.2 mg/dL (ref 0.0–1.0)
pH: 6 (ref 5.0–8.0)

## 2014-11-22 ENCOUNTER — Encounter: Payer: Self-pay | Admitting: Gynecology

## 2014-11-22 ENCOUNTER — Ambulatory Visit (INDEPENDENT_AMBULATORY_CARE_PROVIDER_SITE_OTHER): Payer: BLUE CROSS/BLUE SHIELD | Admitting: Gynecology

## 2014-11-22 VITALS — BP 116/70

## 2014-11-22 DIAGNOSIS — A499 Bacterial infection, unspecified: Secondary | ICD-10-CM | POA: Diagnosis not present

## 2014-11-22 DIAGNOSIS — N898 Other specified noninflammatory disorders of vagina: Secondary | ICD-10-CM

## 2014-11-22 DIAGNOSIS — Z113 Encounter for screening for infections with a predominantly sexual mode of transmission: Secondary | ICD-10-CM

## 2014-11-22 DIAGNOSIS — B9689 Other specified bacterial agents as the cause of diseases classified elsewhere: Secondary | ICD-10-CM

## 2014-11-22 DIAGNOSIS — N760 Acute vaginitis: Secondary | ICD-10-CM

## 2014-11-22 LAB — WET PREP FOR TRICH, YEAST, CLUE
TRICH WET PREP: NONE SEEN
WBC, Wet Prep HPF POC: NONE SEEN
YEAST WET PREP: NONE SEEN

## 2014-11-22 LAB — HEPATITIS B SURFACE ANTIGEN: Hepatitis B Surface Ag: NEGATIVE

## 2014-11-22 LAB — HEPATITIS C ANTIBODY: HCV AB: NEGATIVE

## 2014-11-22 MED ORDER — TINIDAZOLE 500 MG PO TABS: ORAL_TABLET | ORAL | Status: DC

## 2014-11-22 NOTE — Progress Notes (Signed)
    Patient is a [age] year old that presented to the office complaining of vaginal discharge for the past few days. Patient last week had tried over-the-counter Monistat for 3 days. Patient reports that she has a new sexual partner. She's on the oral contraception pills and at times has used condoms. She reports that she completed the HPV vaccine series several years ago. Patient denies any dysuria or frequency nausea or vomiting. Last menstrual period 2 weeks ago.   Exam: Blood pressure 116/70 Gen. appearance well-developed well-nourished female with the above-mentioned complaint Pelvic exam: Bartholin urethra Skene was within normal limits Vagina: Clear-like fishy odor discharge Cervix: No lesions or discharge Bimanual exam: Not done Rectal exam: Not done  Wet prep: Positive amine, many clue cells, too numerous to count bacteria  GC and Chlamydia culture pending at time of this dictation  Assessment/plan: Clinical evidence of bacterial vaginosis will be treated with Tindamax 500 mg tablet. Patient to take for today repeat 40 tablets in 24 hours. To complete an STD screening the following was ordered: GC and Chlamydia culture, hepatitis B and C, HIV and RPR.

## 2014-11-22 NOTE — Patient Instructions (Signed)
Tinidazole tablets What is this medicine? TINIDAZOLE (tye NI da zole) is an antiinfective. It is used to treat amebiasis, giardiasis, trichomoniasis, and vaginosis. It will not work for colds, flu, or other viral infections. This medicine may be used for other purposes; ask your health care provider or pharmacist if you have questions. COMMON BRAND NAME(S): Tindamax What should I tell my health care provider before I take this medicine? They need to know if you have any of these conditions: -anemia or other blood disorders -if you frequently drink alcohol containing drinks -receiving hemodialysis -seizure disorder -an unusual or allergic reaction to tinidazole, other medicines, foods, dyes, or preservatives -pregnant or trying to get pregnant -breast-feeding How should I use this medicine? Take this medicine by mouth with a full glass of water. Follow the directions on the prescription label. Take with food. Take your medicine at regular intervals. Do not take your medicine more often than directed. Take all of your medicine as directed even if you think you are better. Do not skip doses or stop your medicine early. Talk to your pediatrician regarding the use of this medicine in children. While this drug may be prescribed for children as young as 3 years of age for selected conditions, precautions do apply. Overdosage: If you think you have taken too much of this medicine contact a poison control center or emergency room at once. NOTE: This medicine is only for you. Do not share this medicine with others. What if I miss a dose? If you miss a dose, take it as soon as you can. If it is almost time for your next dose, take only that dose. Do not take double or extra doses. What may interact with this medicine? Do not take this medicine with any of the following medications: -alcohol or any product that contains alcohol -amprenavir oral solution -disulfiram -paclitaxel injection -ritonavir  oral solution -sertraline oral solution -sulfamethoxazole-trimethoprim injection This medicine may also interact with the following medications: -cholestyramine -cimetidine -conivaptan -cyclosporin -fluorouracil -fosphenytoin, phenytoin -ketoconazole -lithium -phenobarbital -tacrolimus -warfarin This list may not describe all possible interactions. Give your health care provider a list of all the medicines, herbs, non-prescription drugs, or dietary supplements you use. Also tell them if you smoke, drink alcohol, or use illegal drugs. Some items may interact with your medicine. What should I watch for while using this medicine? Tell your doctor or health care professional if your symptoms do not improve or if they get worse. Avoid alcoholic drinks while you are taking this medicine and for three days afterward. Alcohol may make you feel dizzy, sick, or flushed. If you are being treated for a sexually transmitted disease, avoid sexual contact until you have finished your treatment. Your sexual partner may also need treatment. What side effects may I notice from receiving this medicine? Side effects that you should report to your doctor or health care professional as soon as possible: -allergic reactions like skin rash, itching or hives, swelling of the face, lips, or tongue -breathing problems -confusion, depression -dark or white patches in the mouth -feeling faint or lightheaded, falls -fever, infection -numbness, tingling, pain or weakness in the hands or feet -pain when passing urine -seizures -unusually weak or tired -vaginal irritation or discharge -vomiting Side effects that usually do not require medical attention (report to your doctor or health care professional if they continue or are bothersome): -dark brown or reddish urine -diarrhea -headache -loss of appetite -metallic taste -nausea -stomach upset This list may not describe all   possible side effects. Call your  doctor for medical advice about side effects. You may report side effects to FDA at 1-800-FDA-1088. Where should I keep my medicine? Keep out of the reach of children. Store at room temperature between 15 and 30 degrees C (59 and 86 degrees F). Protect from light and moisture. Keep container tightly closed. Throw away any unused medicine after the expiration date. NOTE: This sheet is a summary. It may not cover all possible information. If you have questions about this medicine, talk to your doctor, pharmacist, or health care provider.  2015, Elsevier/Gold Standard. (2008-01-23 15:22:28) Bacterial Vaginosis Bacterial vaginosis is a vaginal infection that occurs when the normal balance of bacteria in the vagina is disrupted. It results from an overgrowth of certain bacteria. This is the most common vaginal infection in women of childbearing age. Treatment is important to prevent complications, especially in pregnant women, as it can cause a premature delivery. CAUSES  Bacterial vaginosis is caused by an increase in harmful bacteria that are normally present in smaller amounts in the vagina. Several different kinds of bacteria can cause bacterial vaginosis. However, the reason that the condition develops is not fully understood. RISK FACTORS Certain activities or behaviors can put you at an increased risk of developing bacterial vaginosis, including:  Having a new sex partner or multiple sex partners.  Douching.  Using an intrauterine device (IUD) for contraception. Women do not get bacterial vaginosis from toilet seats, bedding, swimming pools, or contact with objects around them. SIGNS AND SYMPTOMS  Some women with bacterial vaginosis have no signs or symptoms. Common symptoms include:  Grey vaginal discharge.  A fishlike odor with discharge, especially after sexual intercourse.  Itching or burning of the vagina and vulva.  Burning or pain with urination. DIAGNOSIS  Your health care  provider will take a medical history and examine the vagina for signs of bacterial vaginosis. A sample of vaginal fluid may be taken. Your health care provider will look at this sample under a microscope to check for bacteria and abnormal cells. A vaginal pH test may also be done.  TREATMENT  Bacterial vaginosis may be treated with antibiotic medicines. These may be given in the form of a pill or a vaginal cream. A second round of antibiotics may be prescribed if the condition comes back after treatment.  HOME CARE INSTRUCTIONS   Only take over-the-counter or prescription medicines as directed by your health care provider.  If antibiotic medicine was prescribed, take it as directed. Make sure you finish it even if you start to feel better.  Do not have sex until treatment is completed.  Tell all sexual partners that you have a vaginal infection. They should see their health care provider and be treated if they have problems, such as a mild rash or itching.  Practice safe sex by using condoms and only having one sex partner. SEEK MEDICAL CARE IF:   Your symptoms are not improving after 3 days of treatment.  You have increased discharge or pain.  You have a fever. MAKE SURE YOU:   Understand these instructions.  Will watch your condition.  Will get help right away if you are not doing well or get worse. FOR MORE INFORMATION  Centers for Disease Control and Prevention, Division of STD Prevention: www.cdc.gov/std American Sexual Health Association (ASHA): www.ashastd.org  Document Released: 04/27/2005 Document Revised: 02/15/2013 Document Reviewed: 12/07/2012 ExitCare Patient Information 2015 ExitCare[employer] LLC. This information is not intended to replace advice given to you   by your health care provider. Make sure you discuss any questions you have with your health care provider.  

## 2014-11-23 LAB — HIV ANTIBODY (ROUTINE TESTING W REFLEX): HIV: NONREACTIVE

## 2014-11-23 LAB — GC/CHLAMYDIA PROBE AMP
CT Probe RNA: NEGATIVE
GC PROBE AMP APTIMA: NEGATIVE

## 2014-11-23 LAB — RPR

## 2015-02-19 ENCOUNTER — Encounter: Payer: Self-pay | Admitting: Women's Health

## 2015-02-19 ENCOUNTER — Ambulatory Visit (INDEPENDENT_AMBULATORY_CARE_PROVIDER_SITE_OTHER): Payer: BLUE CROSS/BLUE SHIELD | Admitting: Women's Health

## 2015-02-19 VITALS — BP 118/80 | Ht 67.0 in | Wt 124.0 lb

## 2015-02-19 DIAGNOSIS — Z30011 Encounter for initial prescription of contraceptive pills: Secondary | ICD-10-CM | POA: Diagnosis not present

## 2015-02-19 DIAGNOSIS — R350 Frequency of micturition: Secondary | ICD-10-CM | POA: Diagnosis not present

## 2015-02-19 DIAGNOSIS — N898 Other specified noninflammatory disorders of vagina: Secondary | ICD-10-CM

## 2015-02-19 DIAGNOSIS — A499 Bacterial infection, unspecified: Secondary | ICD-10-CM

## 2015-02-19 DIAGNOSIS — N760 Acute vaginitis: Secondary | ICD-10-CM | POA: Diagnosis not present

## 2015-02-19 DIAGNOSIS — B9689 Other specified bacterial agents as the cause of diseases classified elsewhere: Secondary | ICD-10-CM

## 2015-02-19 LAB — URINALYSIS W MICROSCOPIC + REFLEX CULTURE
Bilirubin Urine: NEGATIVE
CRYSTALS: NONE SEEN [HPF]
Casts: NONE SEEN [LPF]
GLUCOSE, UA: NEGATIVE
Hgb urine dipstick: NEGATIVE
Ketones, ur: NEGATIVE
LEUKOCYTES UA: NEGATIVE
Nitrite: NEGATIVE
PROTEIN: NEGATIVE
SPECIFIC GRAVITY, URINE: 1.025 (ref 1.001–1.035)
Yeast: NONE SEEN [HPF]
pH: 6.5 (ref 5.0–8.0)

## 2015-02-19 LAB — WET PREP FOR TRICH, YEAST, CLUE
TRICH WET PREP: NONE SEEN
YEAST WET PREP: NONE SEEN

## 2015-02-19 MED ORDER — METRONIDAZOLE 500 MG PO TABS: 500.0000 mg | ORAL_TABLET | Freq: Two times a day (BID) | ORAL | Status: DC

## 2015-02-19 NOTE — Progress Notes (Signed)
Patient ID: Melody Mcclure, female   DOB: 06-24-1990, 24 y.o.   MRN: [id number] Presents with a 3 day history of acne and thick yellow vaginal discharge.  Denies urinary symptoms, vaginal odor, itching, fever, or abdominal pain.  Periods every other month on continuous Sprintec, misses 3-4 pills monthly without breakthrough bleeding.  Not sexually active.     Exam: Appears well.  External genitalia normal.  Speculum exam reveals normal cervix and vaginal walls with moderate thick white discharge and odor.   Wet Prep: Clue cells: moderate, WBC: few, Bacteria: TNTC UA: Negative  Bacterial Vaginosis  Plan: Flagyl  BID, x7 days, alcohol precautions, discussed BV prevention. Discussed other contraception options, declines NuvaRing at this time.  Reviewed importance of taking OCs daily. Call if symptoms persist or do not resolve.

## 2015-02-19 NOTE — Patient Instructions (Signed)

## 2015-02-21 LAB — URINE CULTURE
Colony Count: NO GROWTH
Organism ID, Bacteria: NO GROWTH

## 2015-06-11 ENCOUNTER — Encounter (HOSPITAL_COMMUNITY): Payer: Self-pay

## 2015-06-11 ENCOUNTER — Emergency Department (HOSPITAL_COMMUNITY)
Admission: EM | Admit: 2015-06-11 | Discharge: 2015-06-11 | Disposition: A | Discharge status: Home / Self Care | Payer: BLUE CROSS/BLUE SHIELD | Attending: Emergency Medicine | Admitting: Emergency Medicine

## 2015-06-11 DIAGNOSIS — Z207 Contact with and (suspected) exposure to pediculosis, acariasis and other infestations: Secondary | ICD-10-CM | POA: Insufficient documentation

## 2015-06-11 DIAGNOSIS — Z792 Long term (current) use of antibiotics: Secondary | ICD-10-CM | POA: Insufficient documentation

## 2015-06-11 DIAGNOSIS — R21 Rash and other nonspecific skin eruption: Secondary | ICD-10-CM | POA: Diagnosis not present

## 2015-06-11 DIAGNOSIS — Z87891 Personal history of nicotine dependence: Secondary | ICD-10-CM | POA: Insufficient documentation

## 2015-06-11 DIAGNOSIS — Z2089 Contact with and (suspected) exposure to other communicable diseases: Secondary | ICD-10-CM

## 2015-06-11 MED ORDER — PERMETHRIN 5 % EX CREA: TOPICAL_CREAM | CUTANEOUS | Status: DC

## 2015-06-11 NOTE — ED Notes (Signed)
Pt c/o scabies exposure pt's partner has been diagnosed

## 2015-06-11 NOTE — ED Notes (Signed)
Pt stable, ambulatory, states understanding of discharge instructions 

## 2015-06-11 NOTE — Discharge Instructions (Signed)
Use permethrin lotion as prescribed. Use benadryl or other over-the-counter antihistamine as needed for itching. Wash all linens/clothing/fabric items in HOT water, if unable to wash item then wrap in plastic for 24 hours. Continue your usual home medications. Get plenty of rest and drink plenty of fluids. Avoid any known triggers. Please followup with your primary doctor in 1-2 weeks for recheck of symptoms and ongoing medical care. Return to the ER for changes or worsening symptoms.

## 2015-06-11 NOTE — ED Provider Notes (Signed)
CSN: [id number]     Arrival date & time 06/11/15  2236 History  By signing my name below, I, Melody Mcclure, attest that this documentation has been prepared under the direction and in the presence of Maryanne Huneycutt Camprubi-Soms, PA-C. Electronically Signed: Budd Mcclure, ED Scribe. 06/11/2015. 11:10 PM.    Chief Complaint  Patient presents with  . Rash   Patient is a 25 y.o. female presenting with rash. The history is provided by the patient. No language interpreter was used.  Rash Location:  Full body Quality: itchiness and redness   Quality: not draining, not painful, not swelling and not weeping   Severity:  Moderate Onset quality:  Gradual Duration:  1 week Timing:  Constant Progression:  Spreading Chronicity:  New Context: exposure to similar rash and sick contacts   Context: not animal contact, not chemical exposure, not food, not hot tub use, not insect bite/sting, not medications, not new detergent/soap and not plant contact   Relieved by:  None tried Worsened by:  Nothing tried Ineffective treatments:  None tried Associated symptoms: no abdominal pain, no diarrhea, no fever, no joint pain, no myalgias, no nausea, no periorbital edema, no shortness of breath, no throat swelling, no tongue swelling and not vomiting    HPI Comments: Melody Mcclure is a 25 y.o. female former smoker, who presents to the Emergency Department complaining of an itchy red rash to her entire body onset 1 week ago. Pt states her partner was recently diagnosed with scabies and he has been in her home. She has not tried anything for this. She denies any recent exposure to new products or environments, no new soaps/detergents/animal/plant contact/hot tub use/medications/etc. No known aggravating factors. Pt denies skin swelling, abscesses, draining pustules, weeping, pain, warmth, fevers, chills, tongue or lip swelling, CP, SOB, abdominal pain, n/v/d, constipation, urinary symptoms, numbness, weakness, and  tingling. NKDA.   Past Medical History  Diagnosis Date  . History of syncope     last episode 2014  . Palpitations   . SOB (shortness of breath)    Past Surgical History  Procedure Laterality Date  . No past surgeries     Family History  Problem Relation Age of Onset  . Asthma Brother   . Cancer Paternal Grandfather     Lung cancer   Social History  Substance Use Topics  . Smoking status: Former Games developer  . Smokeless tobacco: Never Used  . Alcohol Use: 0.0 oz/week    0 Standard drinks or equivalent per week     Comment: not every week - social   OB History    Gravida Para Term Preterm AB TAB SAB Ectopic Multiple Living   0              Review of Systems  Constitutional: Negative for fever and chills.  HENT: Negative for facial swelling.   Respiratory: Negative for shortness of breath.   Cardiovascular: Negative for chest pain.  Gastrointestinal: Negative for nausea, vomiting, abdominal pain, diarrhea and constipation.  Genitourinary: Negative for dysuria, hematuria and difficulty urinating.  Musculoskeletal: Negative for myalgias and arthralgias.  Skin: Positive for rash. Negative for wound.  Allergic/Immunologic: Negative for immunocompromised state.  Neurological: Negative for numbness.   10 Systems reviewed and all are negative for acute change except as noted in the HPI.   Allergies  Review of patient's allergies indicates no known allergies.  Home Medications   Prior to Admission medications   Medication Sig Start Date End Date Taking? Authorizing Provider  metroNIDAZOLE (FLAGYL) 500 MG tablet Take 1 tablet (500 mg total) by mouth 2 (two) times daily. 02/19/15   Harrington Challenger, NP  norgestimate-ethinyl estradiol (ORTHO-CYCLEN,SPRINTEC,PREVIFEM) 0.25-35 MG-MCG tablet Take continuously, skip placebo week 08/22/14   Harrington Challenger, NP   BP 130/82 mmHg  Pulse 85  Temp(Src) 98.1 F (36.7 C) (Oral)  Resp 18  SpO2 100% Physical Exam  Constitutional: She is  oriented to person, place, and time. Vital signs are normal. She appears well-developed and well-nourished.  Non-toxic appearance. No distress.  Afebrile, nontoxic, NAD  HENT:  Head: Normocephalic and atraumatic.  Mouth/Throat: Mucous membranes are normal.  Eyes: Conjunctivae and EOM are normal. Right eye exhibits no discharge. Left eye exhibits no discharge.  Neck: Normal range of motion. Neck supple.  Cardiovascular: Normal rate.   Pulmonary/Chest: Effort normal. No respiratory distress.  Abdominal: Normal appearance. She exhibits no distension.  Musculoskeletal: Normal range of motion.  Neurological: She is alert and oriented to person, place, and time. She has normal strength. No sensory deficit.  Skin: Skin is warm, dry and intact. Rash noted. Rash is maculopapular.  Diffuse erythematous maculopapular rash on all extremities, no burrowing or interdigital web space involvement, no cellulitis or abscess, no warmth or drainage. No intertrigonous involvement.  Psychiatric: She has a normal mood and affect. Her behavior is normal.  Nursing note and vitals reviewed.   ED Course  Procedures  DIAGNOSTIC STUDIES: Oxygen Saturation is 100% on RA, normal by my interpretation.    COORDINATION OF CARE: 11:04 PM - Discussed plans to preventatively for scabies. Advised to f/u with a PCP in the next 1-2 weeks. Pt advised of plan for treatment and pt agrees.  Labs Review Labs Reviewed - No data to display  Imaging Review No results found. I have personally reviewed and evaluated these images and lab results as part of my medical decision-making.   EKG Interpretation None      MDM   Final diagnoses:  Scabies exposure  Rash    25 y.o. female here with concern for scabies exposure. Has had rash x1wk, +scabies contact. No other changes in soaps/detergents/etc. No cellulitis or abscess. Will treat for scabies. PRN benadryl or antihistamines discussed. Discussed washing clothing/linens in  hot water, or wrapping everything in plastic if unable to wash it. F/up with PCP in 1wk. I explained the diagnosis and have given explicit precautions to return to the ER including for any other new or worsening symptoms. The patient understands and accepts the medical plan as it's been dictated and I have answered their questions. Discharge instructions concerning home care and prescriptions have been given. The patient is STABLE and is discharged to home in good condition.   I personally performed the services described in this documentation, which was scribed in my presence. The recorded information has been reviewed and is accurate.  BP 130/82 mmHg  Pulse 85  Temp(Src) 98.1 F (36.7 C) (Oral)  Resp 18  SpO2 100%  Meds ordered this encounter  Medications  . permethrin (ELIMITE) 5 % cream    Sig: Apply a generous amount of cream from head to feet, leave on for 8 to 14 hours, wash with soap/water    Dispense:  60 g    Refill:  0    Order Specific Question:  Supervising Provider    Answer:  Eber Hong [[id number]]     Skipper Dacosta Camprubi-Soms, PA-C 06/11/15 2319  Gerhard Munch, MD 06/12/15 [phone]

## 2015-07-31 ENCOUNTER — Encounter: Payer: Self-pay | Admitting: Women's Health

## 2015-07-31 ENCOUNTER — Ambulatory Visit (INDEPENDENT_AMBULATORY_CARE_PROVIDER_SITE_OTHER): Payer: BLUE CROSS/BLUE SHIELD | Admitting: Women's Health

## 2015-07-31 VITALS — BP 110/78

## 2015-07-31 DIAGNOSIS — N760 Acute vaginitis: Secondary | ICD-10-CM

## 2015-07-31 DIAGNOSIS — A499 Bacterial infection, unspecified: Secondary | ICD-10-CM | POA: Diagnosis not present

## 2015-07-31 DIAGNOSIS — B9689 Other specified bacterial agents as the cause of diseases classified elsewhere: Secondary | ICD-10-CM | POA: Insufficient documentation

## 2015-07-31 LAB — WET PREP FOR TRICH, YEAST, CLUE
TRICH WET PREP: NONE SEEN
WBC, Wet Prep HPF POC: NONE SEEN
Yeast Wet Prep HPF POC: NONE SEEN

## 2015-07-31 MED ORDER — METRONIDAZOLE 500 MG PO TABS: 500.0000 mg | ORAL_TABLET | Freq: Two times a day (BID) | ORAL | Status: DC

## 2015-07-31 NOTE — Progress Notes (Signed)
Patient ID: Melody Mcclure, female   DOB: Oct 06, 1990, 25 y.o.   MRN: [id number] Presents with complaint of increased vaginal discharge with odor. Has had problems with recurrent BV. Not sexually active for greater than 6 months, negative STD screen. Regular monthly cycle on Ortho-Cyclen without complaint. Denies urinary symptoms, abdominal pain or fever.  Exam: Appears well. External genitalia within normal limits, speculum exam moderate amount of a white adherent discharge with odor noted, wet prep positive for many clues, TNTC bacteria. Bimanual no CMT or adnexal fullness or tenderness.  Recurrent Bacteria vaginosis  Plan: Flagyl 500 twice daily for 7 days, alcohol precautions reviewed. After symptoms resolve boric acid 600 mg twice weekly per vagina. Prescriptions for both given instructed to call if continued problems. Aware to use condoms if sexually active.

## 2015-07-31 NOTE — Patient Instructions (Signed)

## 2015-08-23 ENCOUNTER — Encounter: Payer: BLUE CROSS/BLUE SHIELD | Admitting: Women's Health

## 2015-08-28 ENCOUNTER — Ambulatory Visit (INDEPENDENT_AMBULATORY_CARE_PROVIDER_SITE_OTHER): Payer: BLUE CROSS/BLUE SHIELD | Admitting: Women's Health

## 2015-08-28 ENCOUNTER — Encounter: Payer: Self-pay | Admitting: Women's Health

## 2015-08-28 VITALS — BP 124/80 | Ht 67.0 in | Wt 125.0 lb

## 2015-08-28 DIAGNOSIS — Z30011 Encounter for initial prescription of contraceptive pills: Secondary | ICD-10-CM

## 2015-08-28 DIAGNOSIS — Z113 Encounter for screening for infections with a predominantly sexual mode of transmission: Secondary | ICD-10-CM

## 2015-08-28 DIAGNOSIS — B373 Candidiasis of vulva and vagina: Secondary | ICD-10-CM

## 2015-08-28 DIAGNOSIS — N898 Other specified noninflammatory disorders of vagina: Secondary | ICD-10-CM | POA: Diagnosis not present

## 2015-08-28 DIAGNOSIS — Z01419 Encounter for gynecological examination (general) (routine) without abnormal findings: Secondary | ICD-10-CM

## 2015-08-28 DIAGNOSIS — N760 Acute vaginitis: Secondary | ICD-10-CM | POA: Diagnosis not present

## 2015-08-28 DIAGNOSIS — A499 Bacterial infection, unspecified: Secondary | ICD-10-CM | POA: Diagnosis not present

## 2015-08-28 DIAGNOSIS — B9689 Other specified bacterial agents as the cause of diseases classified elsewhere: Secondary | ICD-10-CM

## 2015-08-28 DIAGNOSIS — B3731 Acute candidiasis of vulva and vagina: Secondary | ICD-10-CM

## 2015-08-28 LAB — CBC WITH DIFFERENTIAL/PLATELET
BASOS PCT: 0 %
Basophils Absolute: 0 cells/uL (ref 0–200)
EOS PCT: 2 %
Eosinophils Absolute: 84 cells/uL (ref 15–500)
HCT: 38.8 % (ref 35.0–45.0)
Hemoglobin: 12.2 g/dL (ref 11.7–15.5)
LYMPHS ABS: 1596 {cells}/uL (ref 850–3900)
Lymphocytes Relative: 38 %
MCH: 23.4 pg — AB (ref 27.0–33.0)
MCHC: 31.4 g/dL — ABNORMAL LOW (ref 32.0–36.0)
MCV: 74.5 fL — ABNORMAL LOW (ref 80.0–100.0)
MONOS PCT: 6 %
MPV: 10.2 fL (ref 7.5–12.5)
Monocytes Absolute: 252 cells/uL (ref 200–950)
NEUTROS ABS: 2268 {cells}/uL (ref 1500–7800)
Neutrophils Relative %: 54 %
PLATELETS: 287 10*3/uL (ref 140–400)
RBC: 5.21 MIL/uL — AB (ref 3.80–5.10)
RDW: 16.2 % — ABNORMAL HIGH (ref 11.0–15.0)
WBC: 4.2 10*3/uL (ref 3.8–10.8)

## 2015-08-28 LAB — OB RESULTS CONSOLE GC/CHLAMYDIA
CHLAMYDIA, DNA PROBE: NEGATIVE
Gonorrhea: NEGATIVE

## 2015-08-28 LAB — WET PREP FOR TRICH, YEAST, CLUE: Trich, Wet Prep: NONE SEEN

## 2015-08-28 MED ORDER — METRONIDAZOLE 500 MG PO TABS: 500.0000 mg | ORAL_TABLET | Freq: Two times a day (BID) | ORAL | Status: DC

## 2015-08-28 MED ORDER — NORGESTIMATE-ETH ESTRADIOL 0.25-35 MG-MCG PO TABS: ORAL_TABLET | ORAL | Status: DC

## 2015-08-28 MED ORDER — FLUCONAZOLE 150 MG PO TABS: 150.0000 mg | ORAL_TABLET | Freq: Once | ORAL | Status: DC

## 2015-08-28 NOTE — Progress Notes (Signed)
Melody Mcclure 1991-02-01 [id number]    History:    Presents for annual exam.  Monthly cycle on Sprintec. New partner. 2014 ascus with positive HR HPV, Pap normal 2015. Gardasil series completed. Has had recurrent BV.  Past medical history, past surgical history, family history and social history were all reviewed and documented in the EPIC chart. 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 Parents healthy.  ROS:  A ROS was performed and pertinent positives and negatives are included.  Exam:  Filed Vitals:   08/28/15 0907  BP: 124/80    General appearance:  Normal Thyroid:  Symmetrical, normal in size, without palpable masses or nodularity. Respiratory  Auscultation:  Clear without wheezing or rhonchi Cardiovascular  Auscultation:  Regular rate, without rubs, murmurs or gallops  Edema/varicosities:  Not grossly evident Abdominal  Soft,nontender, without masses, guarding or rebound.  Liver/spleen:  No organomegaly noted  Hernia:  None appreciated  Skin  Inspection:  Grossly normal   Breasts: Examined lying and sitting/. Pierced     Right: Without masses, retractions, discharge or axillary adenopathy.     Left: Without masses, retractions, discharge or axillary adenopathy. Gentitourinary   Inguinal/mons:  Normal without inguinal adenopathy  External genitalia:  Normal  BUS/Urethra/Skene's glands:  Normal  Vagina:  Scant discharge, wet prep positive for few yeast, few clue cells, many bacteria.  Cervix:  Normal  Uterus:   normal in size, shape and contour.  Midline and mobile  Adnexa/parametria:     Rt: Without masses or tenderness.   Lt: Without masses or tenderness.  Anus and perineum: Normal   Assessment/Plan:  25 y.o. SBF G0 for annual exam complaint of increased discharge.  Monthly cycle on Sprintec STD screen Recurrent Bacteria vaginosis Yeast   Plan: Sprintec prescription, proper use, slight risk for blood clots and strokes reviewed. Reviewed  importance of taking daily. Condoms encouraged until permanent partner. SBE's, exercise, calcium rich diet, MVI daily, healthy diet encouraged. Campus safety reviewed. Flagyl 500 twice daily for 7 days, alcohol precautions reviewed., Diflucan 150 by mouth 1 dose. Work acid gelcaps twice weekly after symptoms resolved, call if continued problems with recurrent BV. CBC, UA, Pap, GC/Chlamydia, HIV, hep B, C, RPRHarrington Challenger.     Quintus Premo J Dorothe[facility] Dix Psychiatric CenterWHNP, 9:36 AM 08/28/2015

## 2015-08-28 NOTE — Patient Instructions (Signed)
Health Maintenance, Female Adopting a healthy lifestyle and getting preventive care can go a long way to promote health and wellness. Talk with your health care provider about what schedule of regular examinations is right for you. This is a good chance for you to check in with your provider about disease prevention and staying healthy. In between checkups, there are plenty of things you can do on your own. Experts have done a lot of research about which lifestyle changes and preventive measures are most likely to keep you healthy. Ask your health care provider for more information. WEIGHT AND DIET  Eat a healthy diet  Be sure to include plenty of vegetables, fruits, low-fat dairy products, and lean protein.  Do not eat a lot of foods high in solid fats, added sugars, or salt.  Get regular exercise. This is one of the most important things you can do for your health.  Most adults should exercise for at least 150 minutes each week. The exercise should increase your heart rate and make you sweat (moderate-intensity exercise).  Most adults should also do strengthening exercises at least twice a week. This is in addition to the moderate-intensity exercise.  Maintain a healthy weight  Body mass index (BMI) is a measurement that can be used to identify possible weight problems. It estimates body fat based on height and weight. Your health care provider can help determine your BMI and help you achieve or maintain a healthy weight.  For females 20 years of age and older:   A BMI below 18.5 is considered underweight.  A BMI of 18.5 to 24.9 is normal.  A BMI of 25 to 29.9 is considered overweight.  A BMI of 30 and above is considered obese.  Watch levels of cholesterol and blood lipids  You should start having your blood tested for lipids and cholesterol at 25 years of age, then have this test every 5 years.  You may need to have your cholesterol levels checked more often if:  Your lipid  or cholesterol levels are high.  You are older than 25 years of age.  You are at high risk for heart disease.  CANCER SCREENING   Lung Cancer  Lung cancer screening is recommended for adults 55-80 years old who are at high risk for lung cancer because of a history of smoking.  A yearly low-dose CT scan of the lungs is recommended for people who:  Currently smoke.  Have quit within the past 15 years.  Have at least a 30-pack-year history of smoking. A pack year is smoking an average of one pack of cigarettes a day for 1 year.  Yearly screening should continue until it has been 15 years since you quit.  Yearly screening should stop if you develop a health problem that would prevent you from having lung cancer treatment.  Breast Cancer  Practice breast self-awareness. This means understanding how your breasts normally appear and feel.  It also means doing regular breast self-exams. Let your health care provider know about any changes, no matter how small.  If you are in your 20s or 30s, you should have a clinical breast exam (CBE) by a health care provider every 1-3 years as part of a regular health exam.  If you are 40 or older, have a CBE every year. Also consider having a breast X-ray (mammogram) every year.  If you have a family history of breast cancer, talk to your health care provider about genetic screening.  If you   are at high risk for breast cancer, talk to your health care provider about having an MRI and a mammogram every year.  Breast cancer gene (BRCA) assessment is recommended for women who have family members with BRCA-related cancers. BRCA-related cancers include:  Breast.  Ovarian.  Tubal.  Peritoneal cancers.  Results of the assessment will determine the need for genetic counseling and BRCA1 and BRCA2 testing. Cervical Cancer Your health care provider may recommend that you be screened regularly for cancer of the pelvic organs (ovaries, uterus, and  vagina). This screening involves a pelvic examination, including checking for microscopic changes to the surface of your cervix (Pap test). You may be encouraged to have this screening done every 3 years, beginning at age 21.  For women ages 30-65, health care providers may recommend pelvic exams and Pap testing every 3 years, or they may recommend the Pap and pelvic exam, combined with testing for human papilloma virus (HPV), every 5 years. Some types of HPV increase your risk of cervical cancer. Testing for HPV may also be done on women of any age with unclear Pap test results.  Other health care providers may not recommend any screening for nonpregnant women who are considered low risk for pelvic cancer and who do not have symptoms. Ask your health care provider if a screening pelvic exam is right for you.  If you have had past treatment for cervical cancer or a condition that could lead to cancer, you need Pap tests and screening for cancer for at least 20 years after your treatment. If Pap tests have been discontinued, your risk factors (such as having a new sexual partner) need to be reassessed to determine if screening should resume. Some women have medical problems that increase the chance of getting cervical cancer. In these cases, your health care provider may recommend more frequent screening and Pap tests. Colorectal Cancer  This type of cancer can be detected and often prevented.  Routine colorectal cancer screening usually begins at 25 years of age and continues through 25 years of age.  Your health care provider may recommend screening at an earlier age if you have risk factors for colon cancer.  Your health care provider may also recommend using home test kits to check for hidden blood in the stool.  A small camera at the end of a tube can be used to examine your colon directly (sigmoidoscopy or colonoscopy). This is done to check for the earliest forms of colorectal  cancer.  Routine screening usually begins at age 50.  Direct examination of the colon should be repeated every 5-10 years through 25 years of age. However, you may need to be screened more often if early forms of precancerous polyps or small growths are found. Skin Cancer  Check your skin from head to toe regularly.  Tell your health care provider about any new moles or changes in moles, especially if there is a change in a mole's shape or color.  Also tell your health care provider if you have a mole that is larger than the size of a pencil eraser.  Always use sunscreen. Apply sunscreen liberally and repeatedly throughout the day.  Protect yourself by wearing long sleeves, pants, a wide-brimmed hat, and sunglasses whenever you are outside. HEART DISEASE, DIABETES, AND HIGH BLOOD PRESSURE   High blood pressure causes heart disease and increases the risk of stroke. High blood pressure is more likely to develop in:  People who have blood pressure in the high end   of the normal range (130-139/85-89 mm Hg).  People who are overweight or obese.  People who are African American.  If you are 38-23 years of age, have your blood pressure checked every 3-5 years. If you are 61 years of age or older, have your blood pressure checked every year. You should have your blood pressure measured twice--once when you are at a hospital or clinic, and once when you are not at a hospital or clinic. Record the average of the two measurements. To check your blood pressure when you are not at a hospital or clinic, you can use:  An automated blood pressure machine at a pharmacy.  A home blood pressure monitor.  If you are between 45 years and 39 years old, ask your health care provider if you should take aspirin to prevent strokes.  Have regular diabetes screenings. This involves taking a blood sample to check your fasting blood sugar level.  If you are at a normal weight and have a low risk for diabetes,  have this test once every three years after 25 years of age.  If you are overweight and have a high risk for diabetes, consider being tested at a younger age or more often. PREVENTING INFECTION  Hepatitis B  If you have a higher risk for hepatitis B, you should be screened for this virus. You are considered at high risk for hepatitis B if:  You were born in a country where hepatitis B is common. Ask your health care provider which countries are considered high risk.  Your parents were born in a high-risk country, and you have not been immunized against hepatitis B (hepatitis B vaccine).  You have HIV or AIDS.  You use needles to inject street drugs.  You live with someone who has hepatitis B.  You have had sex with someone who has hepatitis B.  You get hemodialysis treatment.  You take certain medicines for conditions, including cancer, organ transplantation, and autoimmune conditions. Hepatitis C  Blood testing is recommended for:  Everyone born from [birth year] through 1965.  Anyone with known risk factors for hepatitis C. Sexually transmitted infections (STIs)  You should be screened for sexually transmitted infections (STIs) including gonorrhea and chlamydia if:  You are sexually active and are younger than 24 years of age.  You are older than 25 years of age and your health care provider tells you that you are at risk for this type of infection.  Your sexual activity has changed since you were last screened and you are at an increased risk for chlamydia or gonorrhea. Ask your health care provider if you are at risk.  If you do not have HIV, but are at risk, it may be recommended that you take a prescription medicine daily to prevent HIV infection. This is called pre-exposure prophylaxis (PrEP). You are considered at risk if:  You are sexually active and do not regularly use condoms or know the HIV status of your partner(s).  You take drugs by injection.  You are sexually  active with a partner who has HIV. Talk with your health care provider about whether you are at high risk of being infected with HIV. If you choose to begin PrEP, you should first be tested for HIV. You should then be tested every 3 months for as long as you are taking PrEP.  PREGNANCY   If you are premenopausal and you may become pregnant, ask your health care provider about preconception counseling.  If you may  become pregnant, take 400 to 800 micrograms (mcg) of folic acid every day.  If you want to prevent pregnancy, talk to your health care provider about birth control (contraception). OSTEOPOROSIS AND MENOPAUSE   Osteoporosis is a disease in which the bones lose minerals and strength with aging. This can result in serious bone fractures. Your risk for osteoporosis can be identified using a bone density scan.  If you are 61 years of age or older, or if you are at risk for osteoporosis and fractures, ask your health care provider if you should be screened.  Ask your health care provider whether you should take a calcium or vitamin D supplement to lower your risk for osteoporosis.  Menopause may have certain physical symptoms and risks.  Hormone replacement therapy may reduce some of these symptoms and risks. Talk to your health care provider about whether hormone replacement therapy is right for you.  HOME CARE INSTRUCTIONS   Schedule regular health, dental, and eye exams.  Stay current with your immunizations.   Do not use any tobacco products including cigarettes, chewing tobacco, or electronic cigarettes.  If you are pregnant, do not drink alcohol.  If you are breastfeeding, limit how much and how often you drink alcohol.  Limit alcohol intake to no more than 1 drink per day for nonpregnant women. One drink equals 12 ounces of beer, 5 ounces of wine, or 1 ounces of hard liquor.  Do not use street drugs.  Do not share needles.  Ask your health care provider for help if  you need support or information about quitting drugs.  Tell your health care provider if you often feel depressed.  Tell your health care provider if you have ever been abused or do not feel safe at home.   This information is not intended to replace advice given to you by your health care provider. Make sure you discuss any questions you have with your health care provider.   Document Released: 11/10/2010 Document Revised: 05/18/2014 Document Reviewed: 03/29/2013 Elsevier Interactive Patient Education [employer].

## 2015-08-28 NOTE — Addendum Note (Signed)
Addended by: Kem ParkinsonBARNES, Nan Maya on: 08/28/2015 09:45 AM   Modules accepted: Orders

## 2015-08-29 LAB — URINALYSIS W MICROSCOPIC + REFLEX CULTURE
Bilirubin Urine: NEGATIVE
Casts: NONE SEEN [LPF]
Crystals: NONE SEEN [HPF]
GLUCOSE, UA: NEGATIVE
Hgb urine dipstick: NEGATIVE
Ketones, ur: NEGATIVE
NITRITE: NEGATIVE
PH: 7.5 (ref 5.0–8.0)
Protein, ur: NEGATIVE
Specific Gravity, Urine: 1.014 (ref 1.001–1.035)
YEAST: NONE SEEN [HPF]

## 2015-08-29 LAB — HEPATITIS B SURFACE ANTIGEN: HEP B S AG: NEGATIVE

## 2015-08-29 LAB — PAP IG W/ RFLX HPV ASCU

## 2015-08-29 LAB — GC/CHLAMYDIA PROBE AMP
CT Probe RNA: NOT DETECTED
GC Probe RNA: NOT DETECTED

## 2015-08-29 LAB — HIV ANTIBODY (ROUTINE TESTING W REFLEX): HIV: NONREACTIVE

## 2015-08-29 LAB — RPR

## 2015-08-29 LAB — HEPATITIS C ANTIBODY: HCV AB: NEGATIVE

## 2015-08-30 ENCOUNTER — Other Ambulatory Visit: Payer: Self-pay | Admitting: Women's Health

## 2015-08-30 MED ORDER — SULFAMETHOXAZOLE-TRIMETHOPRIM 800-160 MG PO TABS: 1.0000 | ORAL_TABLET | Freq: Two times a day (BID) | ORAL | Status: DC

## 2015-08-31 LAB — URINE CULTURE

## 2015-09-09 ENCOUNTER — Inpatient Hospital Stay (HOSPITAL_COMMUNITY)
Admission: AD | Admit: 2015-09-09 | Discharge: 2015-09-10 | Disposition: A | Discharge status: Home / Self Care | Payer: BLUE CROSS/BLUE SHIELD | Source: Ambulatory Visit | Attending: Obstetrics & Gynecology | Admitting: Obstetrics & Gynecology

## 2015-09-09 ENCOUNTER — Telehealth: Payer: Self-pay | Admitting: *Deleted

## 2015-09-09 DIAGNOSIS — Z3A00 Weeks of gestation of pregnancy not specified: Secondary | ICD-10-CM | POA: Diagnosis not present

## 2015-09-09 DIAGNOSIS — O26891 Other specified pregnancy related conditions, first trimester: Secondary | ICD-10-CM | POA: Diagnosis not present

## 2015-09-09 DIAGNOSIS — O26851 Spotting complicating pregnancy, first trimester: Secondary | ICD-10-CM

## 2015-09-09 DIAGNOSIS — R109 Unspecified abdominal pain: Secondary | ICD-10-CM | POA: Diagnosis not present

## 2015-09-09 DIAGNOSIS — Z87891 Personal history of nicotine dependence: Secondary | ICD-10-CM | POA: Diagnosis not present

## 2015-09-09 DIAGNOSIS — Q513 Bicornate uterus: Secondary | ICD-10-CM

## 2015-09-09 HISTORY — DX: Bicornate uterus: Q51.3

## 2015-09-09 NOTE — MAU Note (Signed)
Pt reports positive home preg test this week, has been having lower abd cramping x one week that are severe at times.

## 2015-09-09 NOTE — Telephone Encounter (Signed)
Pt called stating she was seen at urgent care and informed she is pregnant, pt requesting ultrasound appointment. I called pt back and left message she will need to schedule OV with [facility] before this can be schedule.

## 2015-09-10 ENCOUNTER — Inpatient Hospital Stay (HOSPITAL_COMMUNITY): Payer: BLUE CROSS/BLUE SHIELD

## 2015-09-10 ENCOUNTER — Encounter (HOSPITAL_COMMUNITY): Payer: Self-pay | Admitting: *Deleted

## 2015-09-10 DIAGNOSIS — O26851 Spotting complicating pregnancy, first trimester: Secondary | ICD-10-CM | POA: Diagnosis not present

## 2015-09-10 LAB — CBC WITH DIFFERENTIAL/PLATELET
BASOS PCT: 0 %
Basophils Absolute: 0 10*3/uL (ref 0.0–0.1)
Eosinophils Absolute: 0.2 10*3/uL (ref 0.0–0.7)
Eosinophils Relative: 4 %
HEMATOCRIT: 34.9 % — AB (ref 36.0–46.0)
HEMOGLOBIN: 11.5 g/dL — AB (ref 12.0–15.0)
Lymphocytes Relative: 38 %
Lymphs Abs: 2.6 10*3/uL (ref 0.7–4.0)
MCH: 23.6 pg — ABNORMAL LOW (ref 26.0–34.0)
MCHC: 33 g/dL (ref 30.0–36.0)
MCV: 71.7 fL — ABNORMAL LOW (ref 78.0–100.0)
MONO ABS: 0.3 10*3/uL (ref 0.1–1.0)
MONOS PCT: 5 %
NEUTROS ABS: 3.6 10*3/uL (ref 1.7–7.7)
NEUTROS PCT: 53 %
Platelets: 271 10*3/uL (ref 150–400)
RBC: 4.87 MIL/uL (ref 3.87–5.11)
RDW: 15.2 % (ref 11.5–15.5)
WBC: 6.8 10*3/uL (ref 4.0–10.5)

## 2015-09-10 LAB — POCT PREGNANCY, URINE: Preg Test, Ur: NEGATIVE

## 2015-09-10 LAB — URINALYSIS, ROUTINE W REFLEX MICROSCOPIC
BILIRUBIN URINE: NEGATIVE
GLUCOSE, UA: NEGATIVE mg/dL
HGB URINE DIPSTICK: NEGATIVE
Ketones, ur: 15 mg/dL — AB
Leukocytes, UA: NEGATIVE
Nitrite: NEGATIVE
PH: 6 (ref 5.0–8.0)
Protein, ur: NEGATIVE mg/dL
SPECIFIC GRAVITY, URINE: 1.025 (ref 1.005–1.030)

## 2015-09-10 LAB — ABO/RH: ABO/RH(D): O POS

## 2015-09-10 LAB — HCG, QUANTITATIVE, PREGNANCY: HCG, BETA CHAIN, QUANT, S: 81 m[IU]/mL — AB (ref <5)

## 2015-09-10 NOTE — MAU Provider Note (Signed)
History     CSN: [id number]  Arrival date and time: 09/09/15 2255   First Provider Initiated Contact with Patient 09/10/15 0033      No chief complaint on file.  HPI Ms. Melody Mcclure is a 25 y.o. G0P0 at Unknown who presents to MAU today with complaint of spotting and mild cramping x 3 days. The patient denies UTI symptoms, fever, diarrhea or constipation. She has had frequent nausea with minimal vomiting. She states LMP 07/24/15 and recent +HPT x 3. Patient has had pap smear and infection testing performed in the office within the last month.   OB History    Gravida Para Term Preterm AB TAB SAB Ectopic Multiple Living   1               Past Medical History  Diagnosis Date  . History of syncope     last episode 2014  . Palpitations   . SOB (shortness of breath)     Past Surgical History  Procedure Laterality Date  . No past surgeries      Family History  Problem Relation Age of Onset  . Asthma Brother   . Cancer Paternal Grandfather     Lung cancer    Social History  Substance Use Topics  . Smoking status: Former Games developer  . Smokeless tobacco: Never Used  . Alcohol Use: 0.0 oz/week    0 Standard drinks or equivalent per week     Comment: not every week - social    Allergies: No Known Allergies  Prescriptions prior to admission  Medication Sig Dispense Refill Last Dose  . fluconazole (DIFLUCAN) 150 MG tablet Take 1 tablet (150 mg total) by mouth once. 1 tablet 1   . metroNIDAZOLE (FLAGYL) 500 MG tablet Take 1 tablet (500 mg total) by mouth 2 (two) times daily. 14 tablet 0   . norgestimate-ethinyl estradiol (ORTHO-CYCLEN,SPRINTEC,PREVIFEM) 0.25-35 MG-MCG tablet Take continuously, skip placebo week 3 Package 4   . sulfamethoxazole-trimethoprim (BACTRIM DS,SEPTRA DS) 800-160 MG tablet Take 1 tablet by mouth 2 (two) times daily. 6 tablet 0     Review of Systems  Constitutional: Negative for fever and malaise/fatigue.  Gastrointestinal: Positive for nausea,  vomiting and abdominal pain. Negative for diarrhea and constipation.  Genitourinary: Negative for dysuria, urgency and frequency.       + vaginal bleeding   Physical Exam   Blood pressure 117/67, pulse 83, temperature 97.6 F (36.4 C), temperature source Oral, resp. rate 16, height 5' 7.5" (1.715 m), weight 124 lb (56.246 kg), last menstrual period 07/24/2015, SpO2 100 %.  Physical Exam  Nursing note and vitals reviewed. Constitutional: She is oriented to person, place, and time. She appears well-developed and well-nourished. No distress.  HENT:  Head: Normocephalic and atraumatic.  Cardiovascular: Normal rate.   Respiratory: Effort normal.  GI: Soft. She exhibits no distension and no mass. There is no tenderness. There is no rebound and no guarding.  Neurological: She is alert and oriented to person, place, and time.  Skin: Skin is warm and dry. No erythema.  Psychiatric: She has a normal mood and affect.    Results for orders placed or performed during the hospital encounter of 09/09/15 (from the past 24 hour(s))  Urinalysis, Routine w reflex microscopic (not at [facility])     Status: Abnormal   Collection Time: 09/09/15 11:30 PM  Result Value Ref Range   Color, Urine YELLOW YELLOW   APPearance CLEAR CLEAR   Specific Gravity, Urine 1.025 1.005 -  1.030   pH 6.0 5.0 - 8.0   Glucose, UA NEGATIVE NEGATIVE mg/dL   Hgb urine dipstick NEGATIVE NEGATIVE   Bilirubin Urine NEGATIVE NEGATIVE   Ketones, ur 15 (A) NEGATIVE mg/dL   Protein, ur NEGATIVE NEGATIVE mg/dL   Nitrite NEGATIVE NEGATIVE   Leukocytes, UA NEGATIVE NEGATIVE  Pregnancy, urine POC     Status: None   Collection Time: 09/09/15 11:55 PM  Result Value Ref Range   Preg Test, Ur NEGATIVE NEGATIVE  CBC with Differential/Platelet     Status: Abnormal   Collection Time: 09/10/15 12:06 AM  Result Value Ref Range   WBC 6.8 4.0 - 10.5 K/uL   RBC 4.87 3.87 - 5.11 MIL/uL   Hemoglobin 11.5 (L) 12.0 - 15.0 g/dL   HCT 16.134.9 (L) 09.636.0 -  46.0 %   MCV 71.7 (L) 78.0 - 100.0 fL   MCH 23.6 (L) 26.0 - 34.0 pg   MCHC 33.0 30.0 - 36.0 g/dL   RDW 04.515.2 40.911.5 - 81.115.5 %   Platelets 271 150 - 400 K/uL   Neutrophils Relative % 53 %   Neutro Abs 3.6 1.7 - 7.7 K/uL   Lymphocytes Relative 38 %   Lymphs Abs 2.6 0.7 - 4.0 K/uL   Monocytes Relative 5 %   Monocytes Absolute 0.3 0.1 - 1.0 K/uL   Eosinophils Relative 4 %   Eosinophils Absolute 0.2 0.0 - 0.7 K/uL   Basophils Relative 0 %   Basophils Absolute 0.0 0.0 - 0.1 K/uL  ABO/Rh     Status: None (Preliminary result)   Collection Time: 09/10/15 12:06 AM  Result Value Ref Range   ABO/RH(D) O POS   hCG, quantitative, pregnancy     Status: Abnormal   Collection Time: 09/10/15 12:07 AM  Result Value Ref Range   hCG, Beta Chain, Quant, S 81 (H) <5 mIU/mL   [place] Ob Comp Less 14 Wks  09/10/2015  CLINICAL DATA:  Lower abdominal cramping for 1 week. Spotting 3 days ago. Unsure last menstrual period. Quantitative beta HCG is 81. EXAM: OBSTETRIC <14 WK US AND TRANSVAGINAL OB US TECHNIQUE: Both transabdominal and transvaginal ultrasound examinations were performed for complete evaluation of the gestation as well as the maternal uterus, adnexal regions, and pelvic cul-de-sac. Transvaginal technique was performed to assess early pregnancy. COMPARISON:  None. FINDINGS: Intrauterine gestational sac: No intrauterine gestational sac identified. Yolk sac:  Not identified. Embryo:  Not identified. Cardiac Activity: Not identified. Maternal uterus/adnexae: The uterus is anteverted. No myometrial mass lesions. 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 No abnormal endometrial fluid collections. Cervix is unremarkable. Both ovaries are identified, with right measuring 3.4 x 2.4 x 2.8 cm. Left measures 3.7 x 2.7 x 2.6 cm. Complex appearing cystic structures in both ovaries with surrounding flow consistent with corpus luteal cysts bilaterally. Small amount of free fluid in the pelvis. IMPRESSION: No  intrauterine gestational sac, yolk sac, or fetal pole identified. Differential considerations include intrauterine pregnancy too early to be sonographically visualized, missed abortion, or ectopic pregnancy. Followup ultrasound is recommended in 10-14 days for further evaluation. Bicornuate configuration of the uterus. Bilateral corpus luteal cysts are identified. Electronically Signed   By: Burman NievesWilliam  Stevens M.D.   On: 09/10/2015 01:41   [place] Ob Transvaginal  09/10/2015  CLINICAL DATA:  Lower abdominal cramping for 1 week. Spotting 3 days ago. Unsure last menstrual period. Quantitative beta HCG is 81. EXAM: OBSTETRIC <14 WK US AND TRANSVAGINAL OB US TECHNIQUE: Both transabdominal and transvaginal ultrasound examinations were  performed for complete evaluation of the gestation as well as the maternal uterus, adnexal regions, and pelvic cul-de-sac. Transvaginal technique was performed to assess early pregnancy. COMPARISON:  None. FINDINGS: Intrauterine gestational sac: No intrauterine gestational sac identified. Yolk sac:  Not identified. Embryo:  Not identified. Cardiac Activity: Not identified. Maternal uterus/adnexae: The uterus is anteverted. No myometrial mass lesions. 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 No abnormal endometrial fluid collections. Cervix is unremarkable. Both ovaries are identified, with right measuring 3.4 x 2.4 x 2.8 cm. Left measures 3.7 x 2.7 x 2.6 cm. Complex appearing cystic structures in both ovaries with surrounding flow consistent with corpus luteal cysts bilaterally. Small amount of free fluid in the pelvis. IMPRESSION: No intrauterine gestational sac, yolk sac, or fetal pole identified. Differential considerations include intrauterine pregnancy too early to be sonographically visualized, missed abortion, or ectopic pregnancy. Followup ultrasound is recommended in 10-14 days for further evaluation. Bicornuate configuration of the uterus. Bilateral corpus luteal  cysts are identified. Electronically Signed   By: Burman Nieves M.D.   On: 09/10/2015 01:41    MAU Course  Procedures None  MDM UPT - negative UA, CBC, ABO/Rh and hCG today Discussed lab results with Dr. Hyacinth Meeker. She recommends [place] today. If no evidence of ectopic patient may return to MAU for 48 hour follow-up, unless called from the office with plan to follow-up there. She will give report to [facility] tomorrow.   Assessment and Plan  A: Pregnancy of unknown location Spotting in pregnancy Abdominal pain in pregnancy  P: Discharge home Ectopic precautions discussed Patient advised to follow-up in MAU in ~ 48 hours for repeat labs, unless directed otherwise by the office Patient may return to MAU as needed or if her condition were to change or worsen   Marny Lowenstein, PA-C  09/10/2015, 1:48 AM

## 2015-09-10 NOTE — Discharge Instructions (Signed)

## 2015-09-26 ENCOUNTER — Ambulatory Visit (INDEPENDENT_AMBULATORY_CARE_PROVIDER_SITE_OTHER): Payer: BLUE CROSS/BLUE SHIELD | Admitting: Gynecology

## 2015-09-26 ENCOUNTER — Encounter: Payer: Self-pay | Admitting: Gynecology

## 2015-09-26 VITALS — BP 116/74

## 2015-09-26 DIAGNOSIS — O200 Threatened abortion: Secondary | ICD-10-CM | POA: Diagnosis not present

## 2015-09-26 NOTE — Progress Notes (Signed)
    Melody Mcclure [birth year] [id number]        25 y.o.  G1P0 Presents having [facility]/09/2015 with positive early hCG of 81 and spotting, blood type O positive. Has done no spotting since then. Has had some mild cramps on and off. Is on a prenatal vitamin.  Past medical history,surgical history, problem list, medications, allergies, family history and social history were all reviewed and documented in the EPIC chart.  Directed ROS with pertinent positives and negatives documented in the history of present illness/assessment and plan.  Exam: Kennon PortelaKim Gardner assistant Filed Vitals:   09/26/15 1544  BP: 116/74   General appearance:  Normal Abdomen soft nontender without masses guarding rebound Pelvic external BUS vagina normal. Cervix normal. Uterus soft mildly enlarged midline mobile nontender consistent with early IUP. Adnexa without masses or tenderness  Assessment/Plan:  25 y.o. G1P0 with early IUP. Schedule ultrasound for viability and dates. Patient knows we do not do obstetrics and she will call make an appointment with an OB group in town over the next several weeks for ongoing OB care.    Dara LordsFONTAINE,Riyan Haile P MD, 4:00 PM 09/26/2015

## 2015-09-26 NOTE — Patient Instructions (Signed)
Follow up for ultrasound as scheduled.  You are approximately 5 - [redacted] weeks pregnant. Conception would have occurred mid to late April. Ultrasound will give us a more accurate due date.  Continue on the prenatal vitamins  Make an appointment for continuing obstetrical care with one of the OB groups in town as we discussed over the next several weeks.

## 2015-10-09 ENCOUNTER — Ambulatory Visit (INDEPENDENT_AMBULATORY_CARE_PROVIDER_SITE_OTHER): Payer: BLUE CROSS/BLUE SHIELD | Admitting: Gynecology

## 2015-10-09 ENCOUNTER — Other Ambulatory Visit: Payer: Self-pay | Admitting: Gynecology

## 2015-10-09 ENCOUNTER — Telehealth: Payer: Self-pay | Admitting: *Deleted

## 2015-10-09 ENCOUNTER — Encounter: Payer: Self-pay | Admitting: Gynecology

## 2015-10-09 ENCOUNTER — Ambulatory Visit (INDEPENDENT_AMBULATORY_CARE_PROVIDER_SITE_OTHER): Payer: BLUE CROSS/BLUE SHIELD

## 2015-10-09 VITALS — BP 110/60

## 2015-10-09 DIAGNOSIS — O209 Hemorrhage in early pregnancy, unspecified: Secondary | ICD-10-CM | POA: Diagnosis not present

## 2015-10-09 DIAGNOSIS — Q513 Bicornate uterus: Secondary | ICD-10-CM

## 2015-10-09 DIAGNOSIS — O34591 Maternal care for other abnormalities of gravid uterus, first trimester: Secondary | ICD-10-CM | POA: Diagnosis not present

## 2015-10-09 DIAGNOSIS — O3401 Maternal care for unspecified congenital malformation of uterus, first trimester: Secondary | ICD-10-CM

## 2015-10-09 DIAGNOSIS — O200 Threatened abortion: Secondary | ICD-10-CM

## 2015-10-09 NOTE — Patient Instructions (Signed)
Follow up for appointment with high risk clinic for continued OB care. Office will help you make that arrangement.

## 2015-10-09 NOTE — Telephone Encounter (Signed)
I called clinic and spoke with Darl PikesSusan and was informed that I will need to fax referral with office notes for provider to review, if approved they will contact me with time and date to relay to pt. I explained that doctor is requesting next 2 weeks appointment, I was informed notes will still need to be faxed. Office note faxed., I will wait for response.

## 2015-10-09 NOTE — Telephone Encounter (Signed)
-----   Message from Dara Lordsimothy P Fontaine, MD sent at 10/09/2015 12:47 PM EDT ----- Patient needs appointment for evaluation and ongoing [facility]et[facility] within the next 2 weeks. Diagnosis is bicornuate uterus with IUP

## 2015-10-09 NOTE — Progress Notes (Addendum)
    Dineen Kidlesha Berryhill 1990/07/19 [id number]        25 y.o.  G1P0 presents with her mother and sister to discuss her follow up ultrasound. 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 Her bleeding has resolved and she is doing no cramping. She was asked to follow up for ultrasound here.  Past medical history,surgical history, problem list, medications, allergies, family history and social history were all reviewed and documented in the EPIC chart.  Directed ROS with pertinent positives and negatives documented in the history of present illness/assessment and plan.  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 Crown-rump length 7 weeks 4 days. Gestational sac with amnion surrounding fetal pole. Cervix long and closed. Right ovary with corpus luteal cyst 25 x 15 mm.  Assessment/Plan:  25 y.o. G1P0 with viable IUP at 7 weeks and bicornuate uterus. I reviewed with patient and her family the situation and risks to include significant increased risk of preterm delivery. Possibilities for previable, peri-viable and near-term deliveries discussed. Need to be evaluated by perinatologist and have continued ongoing care by a high risk clinic discussed. We will go ahead and make arrangements for her to initiate this. She understands we do not do obstetrics and that she will continue to have her ongoing care through their office. She is on prenatal vitamins and continue on these. I've asked her to abstain from intercourse in follow up she has any more bleeding or significant cramping.    Dara LordsFONTAINE,Vallarie Fei P MD, 12:43 PM 10/09/2015

## 2015-10-14 NOTE — Telephone Encounter (Signed)
Erie NoeVanessa called from high risk clinic and said that pt will be scheduled in July, I relayed this to Dr.Fontaine as well and he was okay with this.

## 2015-10-15 NOTE — Telephone Encounter (Signed)
I called pt and left on voicemail that they will contact her to schedule however it will be in July and TF is fine with this.

## 2015-10-23 ENCOUNTER — Encounter: Payer: Self-pay | Admitting: *Deleted

## 2015-10-23 DIAGNOSIS — O3400 Maternal care for unspecified congenital malformation of uterus, unspecified trimester: Secondary | ICD-10-CM

## 2015-10-23 DIAGNOSIS — O0990 Supervision of high risk pregnancy, unspecified, unspecified trimester: Secondary | ICD-10-CM

## 2015-10-23 DIAGNOSIS — Q513 Bicornate uterus: Secondary | ICD-10-CM | POA: Insufficient documentation

## 2015-10-23 NOTE — Telephone Encounter (Signed)
Appt. 11/13/15 @ 9:20 am with Dr.Poe

## 2015-11-13 ENCOUNTER — Ambulatory Visit (INDEPENDENT_AMBULATORY_CARE_PROVIDER_SITE_OTHER): Payer: BLUE CROSS/BLUE SHIELD | Admitting: Obstetrics and Gynecology

## 2015-11-13 ENCOUNTER — Encounter: Payer: Self-pay | Admitting: Obstetrics and Gynecology

## 2015-11-13 VITALS — BP 115/72 | HR 86 | Wt 131.5 lb

## 2015-11-13 DIAGNOSIS — Z113 Encounter for screening for infections with a predominantly sexual mode of transmission: Secondary | ICD-10-CM | POA: Diagnosis not present

## 2015-11-13 DIAGNOSIS — O0991 Supervision of high risk pregnancy, unspecified, first trimester: Secondary | ICD-10-CM

## 2015-11-13 LAB — POCT URINALYSIS DIP (DEVICE)
BILIRUBIN URINE: NEGATIVE
Glucose, UA: NEGATIVE mg/dL
Hgb urine dipstick: NEGATIVE
KETONES UR: NEGATIVE mg/dL
Leukocytes, UA: NEGATIVE
Nitrite: NEGATIVE
PH: 7 (ref 5.0–8.0)
Protein, ur: NEGATIVE mg/dL
Specific Gravity, Urine: 1.02 (ref 1.005–1.030)
Urobilinogen, UA: 0.2 mg/dL (ref 0.0–1.0)

## 2015-11-13 NOTE — Progress Notes (Signed)
   Subjective:    Melody Mcclure is a G1P0 1180w0d being seen today for her first obstetrical visit.  Her obstetrical history is significant for bicornuate uterus. Patient does intend to breast feed. Pregnancy history fully reviewed. [facility] Patient reports no complaints.  Filed Vitals:   11/13/15 0920  BP: 115/72  Pulse: 86  Weight: 131 lb 8 oz (59.648 kg)    HISTORY: OB History  Gravida Para Term Preterm AB SAB TAB Ectopic Multiple Living  1             # Outcome Date GA Lbr Len/2nd Weight Sex Delivery Anes PTL Lv  1 Current              Past Medical History  Diagnosis Date  . History of syncope     last episode 2014  . Palpitations   . SOB (shortness of breath)   . Bicornuate uterus 09/2015   Past Surgical History  Procedure Laterality Date  . No past surgeries     Family History  Problem Relation Age of Onset  . Asthma Brother   . Cancer Paternal Grandfather     Lung cancer     Exam    Uterus:   1/3 to U DT FHR 151  Pelvic Exam: Deferred, [facility]                              System: Breast:  normal appearance, no masses or tenderness   Skin: normal coloration and turgor, no rashes    Neurologic: oriented, grossly non-focal   Extremities: normal strength, tone, and muscle mass   HEENT PERRLA, extra ocular movement intact, sclera clear, anicteric, oropharynx clear, no lesions and thyroid without masses   Mouth/Teeth mucous membranes moist, pharynx normal without lesions and dental hygiene good   Neck supple   Cardiovascular: regular rate and rhythm, no murmurs or gallops   Respiratory:  appears well, vitals normal, no respiratory distress, acyanotic, normal RR, ear and throat exam is normal, neck free of mass or lymphadenopathy, chest clear, no wheezing, crepitations, rhonchi, normal symmetric air entry   Abdomen: soft, non-tender; bowel sounds normal; no masses,  no organomegaly   Urinary: deferred       Assessment:    Pregnancy: G1P0 Patient Active Problem List   Diagnosis Date Noted  . Supervision of high risk pregnancy, antepartum 10/23/2015  . Bicornate uterus complicating pregnancy 10/23/2015        Plan:     Initial labs drawn. Prenatal vitamins. Problem list reviewed and updated. Genetic Screening discussed First Screen: requested.  Ultrasound discussed; fetal survey: requested.  Follow up in 4 weeks. 50% of 30 min visit spent on counseling and coordination of care.     Dearius Hoffmann 11/13/2015

## 2015-11-13 NOTE — Progress Notes (Signed)
Pt had [facility], records in epic. Ultrasound from [facility] in care everywhere.

## 2015-11-13 NOTE — Patient Instructions (Signed)
First Trimester of Pregnancy The first trimester of pregnancy is from week 1 until the end of week 12 (months 1 through 3). A week after a sperm fertilizes an egg, the egg will implant on the wall of the uterus. This embryo will begin to develop into a baby. Genes from you and your partner are forming the baby. The female genes determine whether the baby is a boy or a girl. At 6-8 weeks, the eyes and face are formed, and the heartbeat can be seen on ultrasound. At the end of 12 weeks, all the baby's organs are formed.  Now that you are pregnant, you will want to do everything you can to have a healthy baby. Two of the most important things are to get good prenatal care and to follow your health care provider's instructions. Prenatal care is all the medical care you receive before the baby's birth. This care will help prevent, find, and treat any problems during the pregnancy and childbirth. BODY CHANGES Your body goes through many changes during pregnancy. The changes vary from woman to woman.   You may gain or lose a couple of pounds at first.  You may feel sick to your stomach (nauseous) and throw up (vomit). If the vomiting is uncontrollable, call your health care provider.  You may tire easily.  You may develop headaches that can be relieved by medicines approved by your health care provider.  You may urinate more often. Painful urination may mean you have a bladder infection.  You may develop heartburn as a result of your pregnancy.  You may develop constipation because certain hormones are causing the muscles that push waste through your intestines to slow down.  You may develop hemorrhoids or swollen, bulging veins (varicose veins).  Your breasts may begin to grow larger and become tender. Your nipples may stick out more, and the tissue that surrounds them (areola) may become darker.  Your gums may bleed and may be sensitive to brushing and flossing.  Dark spots or blotches (chloasma,  mask of pregnancy) may develop on your face. This will likely fade after the baby is born.  Your menstrual periods will stop.  You may have a loss of appetite.  You may develop cravings for certain kinds of food.  You may have changes in your emotions from day to day, such as being excited to be pregnant or being concerned that something may go wrong with the pregnancy and baby.  You may have more vivid and strange dreams.  You may have changes in your hair. These can include thickening of your hair, rapid growth, and changes in texture. Some women also have hair loss during or after pregnancy, or hair that feels dry or thin. Your hair will most likely return to normal after your baby is born. WHAT TO EXPECT AT YOUR PRENATAL VISITS During a routine prenatal visit:  You will be weighed to make sure you and the baby are growing normally.  Your blood pressure will be taken.  Your abdomen will be measured to track your baby's growth.  The fetal heartbeat will be listened to starting around week 10 or 12 of your pregnancy.  Test results from any previous visits will be discussed. Your health care provider may ask you:  How you are feeling.  If you are feeling the baby move.  If you have had any abnormal symptoms, such as leaking fluid, bleeding, severe headaches, or abdominal cramping.  If you are using any tobacco products,   including cigarettes, chewing tobacco, and electronic cigarettes.  If you have any questions. Other tests that may be performed during your first trimester include:  Blood tests to find your blood type and to check for the presence of any previous infections. They will also be used to check for low iron levels (anemia) and Rh antibodies. Later in the pregnancy, blood tests for diabetes will be done along with other tests if problems develop.  Urine tests to check for infections, diabetes, or protein in the urine.  An ultrasound to confirm the proper growth  and development of the baby.  An amniocentesis to check for possible genetic problems.  Fetal screens for spina bifida and Down syndrome.  You may need other tests to make sure you and the baby are doing well.  HIV (human immunodeficiency virus) testing. Routine prenatal testing includes screening for HIV, unless you choose not to have this test. HOME CARE INSTRUCTIONS  Medicines  Follow your health care provider's instructions regarding medicine use. Specific medicines may be either safe or unsafe to take during pregnancy.  Take your prenatal vitamins as directed.  If you develop constipation, try taking a stool softener if your health care provider approves. Diet  Eat regular, well-balanced meals. Choose a variety of foods, such as meat or vegetable-based protein, fish, milk and low-fat dairy products, vegetables, fruits, and whole grain breads and cereals. Your health care provider will help you determine the amount of weight gain that is right for you.  Avoid raw meat and uncooked cheese. These carry germs that can cause birth defects in the baby.  Eating four or five small meals rather than three large meals a day may help relieve nausea and vomiting. If you start to feel nauseous, eating a few soda crackers can be helpful. Drinking liquids between meals instead of during meals also seems to help nausea and vomiting.  If you develop constipation, eat more high-fiber foods, such as fresh vegetables or fruit and whole grains. Drink enough fluids to keep your urine clear or pale yellow. Activity and Exercise  Exercise only as directed by your health care provider. Exercising will help you:  Control your weight.  Stay in shape.  Be prepared for labor and delivery.  Experiencing pain or cramping in the lower abdomen or low back is a good sign that you should stop exercising. Check with your health care provider before continuing normal exercises.  Try to avoid standing for long  periods of time. Move your legs often if you must stand in one place for a long time.  Avoid heavy lifting.  Wear low-heeled shoes, and practice good posture.  You may continue to have sex unless your health care provider directs you otherwise. Relief of Pain or Discomfort  Wear a good support bra for breast tenderness.   Take warm sitz baths to soothe any pain or discomfort caused by hemorrhoids. Use hemorrhoid cream if your health care provider approves.   Rest with your legs elevated if you have leg cramps or low back pain.  If you develop varicose veins in your legs, wear support hose. Elevate your feet for 15 minutes, 3-4 times a day. Limit salt in your diet. Prenatal Care  Schedule your prenatal visits by the twelfth week of pregnancy. They are usually scheduled monthly at first, then more often in the last 2 months before delivery.  Write down your questions. Take them to your prenatal visits.  Keep all your prenatal visits as directed by your   health care provider. Safety  Wear your seat belt at all times when driving.  Make a list of emergency phone numbers, including numbers for family, friends, the hospital, and police and fire departments. General Tips  Ask your health care provider for a referral to a local prenatal education class. Begin classes no later than at the beginning of month 6 of your pregnancy.  Ask for help if you have counseling or nutritional needs during pregnancy. Your health care provider can offer advice or refer you to specialists for help with various needs.  Do not use hot tubs, steam rooms, or saunas.  Do not douche or use tampons or scented sanitary pads.  Do not cross your legs for long periods of time.  Avoid cat litter boxes and soil used by cats. These carry germs that can cause birth defects in the baby and possibly loss of the fetus by miscarriage or stillbirth.  Avoid all smoking, herbs, alcohol, and medicines not prescribed by  your health care provider. Chemicals in these affect the formation and growth of the baby.  Do not use any tobacco products, including cigarettes, chewing tobacco, and electronic cigarettes. If you need help quitting, ask your health care provider. You may receive counseling support and other resources to help you quit.  Schedule a dentist appointment. At home, brush your teeth with a soft toothbrush and be gentle when you floss. SEEK MEDICAL CARE IF:   You have dizziness.  You have mild pelvic cramps, pelvic pressure, or nagging pain in the abdominal area.  You have persistent nausea, vomiting, or diarrhea.  You have a bad smelling vaginal discharge.  You have pain with urination.  You notice increased swelling in your face, hands, legs, or ankles. SEEK IMMEDIATE MEDICAL CARE IF:   You have a fever.  You are leaking fluid from your vagina.  You have spotting or bleeding from your vagina.  You have severe abdominal cramping or pain.  You have rapid weight gain or loss.  You vomit blood or material that looks like coffee grounds.  You are exposed to German measles and have never had them.  You are exposed to fifth disease or chickenpox.  You develop a severe headache.  You have shortness of breath.  You have any kind of trauma, such as from a fall or a car accident.   This information is not intended to replace advice given to you by your health care provider. Make sure you discuss any questions you have with your health care provider.   Document Released: 04/21/2001 Document Revised: 05/18/2014 Document Reviewed: 03/07/2013 Elsevier Interactive Patient Education 2016 [employer] Inc[employer]  

## 2015-11-14 ENCOUNTER — Encounter (HOSPITAL_COMMUNITY): Payer: Self-pay

## 2015-11-14 ENCOUNTER — Ambulatory Visit (HOSPITAL_COMMUNITY)
Admission: RE | Admit: 2015-11-14 | Discharge: 2015-11-14 | Disposition: A | Discharge status: Home / Self Care | Payer: BLUE CROSS/BLUE SHIELD | Source: Ambulatory Visit | Attending: Obstetrics and Gynecology | Admitting: Obstetrics and Gynecology

## 2015-11-14 ENCOUNTER — Other Ambulatory Visit: Payer: Self-pay | Admitting: Obstetrics and Gynecology

## 2015-11-14 VITALS — BP 106/72 | HR 99 | Wt 132.6 lb

## 2015-11-14 DIAGNOSIS — Z3A13 13 weeks gestation of pregnancy: Secondary | ICD-10-CM | POA: Insufficient documentation

## 2015-11-14 DIAGNOSIS — O0991 Supervision of high risk pregnancy, unspecified, first trimester: Secondary | ICD-10-CM

## 2015-11-14 DIAGNOSIS — O0931 Supervision of pregnancy with insufficient antenatal care, first trimester: Secondary | ICD-10-CM | POA: Insufficient documentation

## 2015-11-14 DIAGNOSIS — Q513 Bicornate uterus: Secondary | ICD-10-CM

## 2015-11-14 DIAGNOSIS — Z369 Encounter for antenatal screening, unspecified: Secondary | ICD-10-CM

## 2015-11-14 DIAGNOSIS — Z36 Encounter for antenatal screening of mother: Secondary | ICD-10-CM | POA: Insufficient documentation

## 2015-11-14 DIAGNOSIS — O3400 Maternal care for unspecified congenital malformation of uterus, unspecified trimester: Secondary | ICD-10-CM

## 2015-11-14 LAB — OBSTETRIC PANEL
Antibody Screen: NEGATIVE
BASOS ABS: 0 {cells}/uL (ref 0–200)
Basophils Relative: 0 %
EOS ABS: 156 {cells}/uL (ref 15–500)
Eosinophils Relative: 2 %
HCT: 34.4 % — ABNORMAL LOW (ref 35.0–45.0)
HEP B S AG: NEGATIVE
Hemoglobin: 11.3 g/dL — ABNORMAL LOW (ref 11.7–15.5)
LYMPHS ABS: 1482 {cells}/uL (ref 850–3900)
Lymphocytes Relative: 19 %
MCH: 24.1 pg — ABNORMAL LOW (ref 27.0–33.0)
MCHC: 32.8 g/dL (ref 32.0–36.0)
MCV: 73.3 fL — ABNORMAL LOW (ref 80.0–100.0)
MONO ABS: 390 {cells}/uL (ref 200–950)
MPV: 11.2 fL (ref 7.5–12.5)
Monocytes Relative: 5 %
NEUTROS ABS: 5772 {cells}/uL (ref 1500–7800)
NEUTROS PCT: 74 %
PLATELETS: 241 10*3/uL (ref 140–400)
RBC: 4.69 MIL/uL (ref 3.80–5.10)
RDW: 15.8 % — ABNORMAL HIGH (ref 11.0–15.0)
RH TYPE: POSITIVE
Rubella: 9.08 Index — ABNORMAL HIGH (ref <0.90)
WBC: 7.8 10*3/uL (ref 3.8–10.8)

## 2015-11-14 LAB — CULTURE, OB URINE

## 2015-11-14 LAB — GC/CHLAMYDIA PROBE AMP (~~LOC~~) NOT AT ARMC
CHLAMYDIA, DNA PROBE: NEGATIVE
NEISSERIA GONORRHEA: NEGATIVE

## 2015-11-16 LAB — PAIN MGMT, PROFILE 6 CONF W/O MM, U
6 Acetylmorphine: NEGATIVE ng/mL (ref <10)
AMPHETAMINES: NEGATIVE ng/mL (ref <500)
Alcohol Metabolites: NEGATIVE ng/mL (ref <500)
BARBITURATES: NEGATIVE ng/mL (ref <300)
Benzodiazepines: NEGATIVE ng/mL (ref <100)
CREATININE: 137.6 mg/dL (ref >=20.0)
Cocaine Metabolite: NEGATIVE ng/mL (ref <150)
Marijuana Metabolite: NEGATIVE ng/mL (ref <20)
Methadone Metabolite: NEGATIVE ng/mL (ref <100)
OXIDANT: NEGATIVE ug/mL (ref <200)
Opiates: NEGATIVE ng/mL (ref <100)
Oxycodone: NEGATIVE ng/mL (ref <100)
PH: 7.08 (ref 4.5–9.0)
PHENCYCLIDINE: NEGATIVE ng/mL (ref <25)
Please note:: 0

## 2015-11-21 ENCOUNTER — Other Ambulatory Visit (HOSPITAL_COMMUNITY): Payer: Self-pay

## 2015-12-05 ENCOUNTER — Ambulatory Visit (HOSPITAL_COMMUNITY)
Admission: RE | Admit: 2015-12-05 | Discharge: 2015-12-05 | Disposition: A | Discharge status: Home / Self Care | Payer: BLUE CROSS/BLUE SHIELD | Source: Ambulatory Visit | Attending: Obstetrics and Gynecology | Admitting: Obstetrics and Gynecology

## 2015-12-05 ENCOUNTER — Encounter (HOSPITAL_COMMUNITY): Payer: Self-pay

## 2015-12-05 ENCOUNTER — Other Ambulatory Visit (HOSPITAL_COMMUNITY): Payer: Self-pay | Admitting: Maternal and Fetal Medicine

## 2015-12-05 DIAGNOSIS — O3402 Maternal care for unspecified congenital malformation of uterus, second trimester: Secondary | ICD-10-CM | POA: Diagnosis present

## 2015-12-05 DIAGNOSIS — O0991 Supervision of high risk pregnancy, unspecified, first trimester: Secondary | ICD-10-CM

## 2015-12-05 DIAGNOSIS — Q513 Bicornate uterus: Principal | ICD-10-CM

## 2015-12-05 DIAGNOSIS — Z3A16 16 weeks gestation of pregnancy: Secondary | ICD-10-CM

## 2015-12-05 DIAGNOSIS — O3400 Maternal care for unspecified congenital malformation of uterus, unspecified trimester: Secondary | ICD-10-CM

## 2015-12-11 ENCOUNTER — Encounter: Payer: BLUE CROSS/BLUE SHIELD | Admitting: Advanced Practice Midwife

## 2015-12-19 ENCOUNTER — Ambulatory Visit (HOSPITAL_COMMUNITY)
Admission: RE | Admit: 2015-12-19 | Discharge: 2015-12-19 | Disposition: A | Discharge status: Home / Self Care | Payer: BLUE CROSS/BLUE SHIELD | Source: Ambulatory Visit | Attending: Obstetrics and Gynecology | Admitting: Obstetrics and Gynecology

## 2015-12-19 ENCOUNTER — Other Ambulatory Visit (HOSPITAL_COMMUNITY): Payer: Self-pay | Admitting: Maternal and Fetal Medicine

## 2015-12-19 ENCOUNTER — Encounter (HOSPITAL_COMMUNITY): Payer: Self-pay

## 2015-12-19 DIAGNOSIS — O34592 Maternal care for other abnormalities of gravid uterus, second trimester: Secondary | ICD-10-CM | POA: Insufficient documentation

## 2015-12-19 DIAGNOSIS — Q513 Bicornate uterus: Secondary | ICD-10-CM | POA: Diagnosis not present

## 2015-12-19 DIAGNOSIS — O0932 Supervision of pregnancy with insufficient antenatal care, second trimester: Secondary | ICD-10-CM | POA: Insufficient documentation

## 2015-12-19 DIAGNOSIS — Z3A18 18 weeks gestation of pregnancy: Secondary | ICD-10-CM

## 2015-12-19 DIAGNOSIS — O3400 Maternal care for unspecified congenital malformation of uterus, unspecified trimester: Secondary | ICD-10-CM

## 2015-12-19 DIAGNOSIS — Z36 Encounter for antenatal screening of mother: Secondary | ICD-10-CM | POA: Diagnosis not present

## 2015-12-23 ENCOUNTER — Ambulatory Visit (INDEPENDENT_AMBULATORY_CARE_PROVIDER_SITE_OTHER): Payer: BLUE CROSS/BLUE SHIELD | Admitting: Family Medicine

## 2015-12-23 VITALS — BP 113/74 | HR 74 | Wt 136.5 lb

## 2015-12-23 DIAGNOSIS — Q513 Bicornate uterus: Secondary | ICD-10-CM

## 2015-12-23 DIAGNOSIS — O0992 Supervision of high risk pregnancy, unspecified, second trimester: Secondary | ICD-10-CM

## 2015-12-23 DIAGNOSIS — O3402 Maternal care for unspecified congenital malformation of uterus, second trimester: Secondary | ICD-10-CM

## 2015-12-23 LAB — POCT URINALYSIS DIP (DEVICE)
Bilirubin Urine: NEGATIVE
GLUCOSE, UA: NEGATIVE mg/dL
Ketones, ur: NEGATIVE mg/dL
Leukocytes, UA: NEGATIVE
Nitrite: NEGATIVE
PH: 7 (ref 5.0–8.0)
PROTEIN: NEGATIVE mg/dL
SPECIFIC GRAVITY, URINE: 1.025 (ref 1.005–1.030)
UROBILINOGEN UA: 0.2 mg/dL (ref 0.0–1.0)

## 2015-12-23 NOTE — Progress Notes (Signed)
Subjective:  Melody Mcclure is a 25 y.o. G1P0 at 7952w5d being seen today for ongoing prenatal care.  She is currently monitored for the following issues for this high-risk pregnancy and has Supervision of high risk pregnancy, antepartum and Bicornate uterus complicating pregnancy on her problem list.  Patient reports no complaints.  Contractions: Not present. Vag. Bleeding: None.  Movement: Present. Denies leaking of fluid.   The following portions of the patient's history were reviewed and updated as appropriate: allergies, current medications, past family history, past medical history, past social history, past surgical history and problem list. Problem list updated.  Objective:   Vitals:   12/23/15 0915  BP: 113/74  Pulse: 74  Weight: 136 lb 8 oz (61.9 kg)    Fetal Status:     Movement: Present     General:  Alert, oriented and cooperative. Patient is in no acute distress.  Skin: Skin is warm and dry. No rash noted.   Cardiovascular: Normal heart rate noted  Respiratory: Normal respiratory effort, no problems with respiration noted  Abdomen: Soft, gravid, appropriate for gestational age. Pain/Pressure: Absent     Pelvic:  Cervical exam deferred        Extremities: Normal range of motion.  Edema: None  Mental Status: Normal mood and affect. Normal behavior. Normal judgment and thought content.   Urinalysis:      Assessment and Plan:  Pregnancy: G1P0 at 6152w5d  1. Supervision of high risk pregnancy, antepartum, second trimester - RTO in 4 weeks  2. Bicornate uterus complicating pregnancy, second trimester - MFM US scheduled; q2 weeks   Preterm labor symptoms and general obstetric precautions including but not limited to vaginal bleeding, contractions, leaking of fluid and fetal movement were reviewed in detail with the patient. Please refer to After Visit Summary for other counseling recommendations.  Return in about 4 weeks (around 01/20/2016) for Routine OB  visit.   [facility]land [place], [place]

## 2015-12-23 NOTE — Patient Instructions (Signed)

## 2016-01-01 ENCOUNTER — Ambulatory Visit (HOSPITAL_COMMUNITY)
Admission: RE | Admit: 2016-01-01 | Discharge: 2016-01-01 | Disposition: A | Discharge status: Home / Self Care | Payer: BLUE CROSS/BLUE SHIELD | Source: Ambulatory Visit | Attending: Nephrology | Admitting: Nephrology

## 2016-01-01 ENCOUNTER — Encounter (HOSPITAL_COMMUNITY): Payer: Self-pay

## 2016-01-01 ENCOUNTER — Other Ambulatory Visit (HOSPITAL_COMMUNITY): Payer: Self-pay | Admitting: Maternal and Fetal Medicine

## 2016-01-01 DIAGNOSIS — Q513 Bicornate uterus: Secondary | ICD-10-CM | POA: Insufficient documentation

## 2016-01-01 DIAGNOSIS — O34592 Maternal care for other abnormalities of gravid uterus, second trimester: Secondary | ICD-10-CM | POA: Diagnosis present

## 2016-01-01 DIAGNOSIS — Z3A20 20 weeks gestation of pregnancy: Secondary | ICD-10-CM | POA: Diagnosis not present

## 2016-01-01 DIAGNOSIS — O3400 Maternal care for unspecified congenital malformation of uterus, unspecified trimester: Secondary | ICD-10-CM

## 2016-01-15 ENCOUNTER — Encounter (HOSPITAL_COMMUNITY): Payer: Self-pay

## 2016-01-15 ENCOUNTER — Ambulatory Visit (HOSPITAL_COMMUNITY)
Admission: RE | Admit: 2016-01-15 | Discharge: 2016-01-15 | Disposition: A | Discharge status: Home / Self Care | Payer: BLUE CROSS/BLUE SHIELD | Source: Ambulatory Visit | Attending: Family Medicine | Admitting: Family Medicine

## 2016-01-15 DIAGNOSIS — Z3A22 22 weeks gestation of pregnancy: Secondary | ICD-10-CM | POA: Diagnosis not present

## 2016-01-15 DIAGNOSIS — Q513 Bicornate uterus: Secondary | ICD-10-CM | POA: Insufficient documentation

## 2016-01-15 DIAGNOSIS — O3400 Maternal care for unspecified congenital malformation of uterus, unspecified trimester: Secondary | ICD-10-CM

## 2016-01-15 DIAGNOSIS — O34592 Maternal care for other abnormalities of gravid uterus, second trimester: Secondary | ICD-10-CM | POA: Diagnosis not present

## 2016-01-21 ENCOUNTER — Telehealth: Payer: Self-pay | Admitting: Family Medicine

## 2016-01-21 NOTE — Telephone Encounter (Signed)
Patient called to say she had class, and would not be able to make this appointment. I informed her it would take her out a few more weeks, but I would call her if something came open sooner. Patient stated she understood.

## 2016-01-22 ENCOUNTER — Encounter: Payer: BLUE CROSS/BLUE SHIELD | Admitting: Obstetrics and Gynecology

## 2016-01-29 ENCOUNTER — Ambulatory Visit (HOSPITAL_COMMUNITY)
Admission: RE | Admit: 2016-01-29 | Discharge: 2016-01-29 | Disposition: A | Discharge status: Home / Self Care | Payer: Medicaid Other | Source: Ambulatory Visit | Attending: Family Medicine | Admitting: Family Medicine

## 2016-01-29 ENCOUNTER — Encounter (HOSPITAL_COMMUNITY): Payer: Self-pay

## 2016-01-29 ENCOUNTER — Ambulatory Visit (INDEPENDENT_AMBULATORY_CARE_PROVIDER_SITE_OTHER): Payer: Medicaid Other | Admitting: Family Medicine

## 2016-01-29 VITALS — BP 110/72 | HR 78 | Wt 141.0 lb

## 2016-01-29 VITALS — BP 108/71 | HR 72 | Wt 143.0 lb

## 2016-01-29 DIAGNOSIS — Z3A24 24 weeks gestation of pregnancy: Secondary | ICD-10-CM | POA: Diagnosis not present

## 2016-01-29 DIAGNOSIS — O34592 Maternal care for other abnormalities of gravid uterus, second trimester: Secondary | ICD-10-CM | POA: Diagnosis not present

## 2016-01-29 DIAGNOSIS — O0992 Supervision of high risk pregnancy, unspecified, second trimester: Secondary | ICD-10-CM

## 2016-01-29 DIAGNOSIS — Z23 Encounter for immunization: Secondary | ICD-10-CM | POA: Diagnosis not present

## 2016-01-29 DIAGNOSIS — Q513 Bicornate uterus: Secondary | ICD-10-CM

## 2016-01-29 DIAGNOSIS — O3400 Maternal care for unspecified congenital malformation of uterus, unspecified trimester: Secondary | ICD-10-CM

## 2016-01-29 DIAGNOSIS — O26872 Cervical shortening, second trimester: Secondary | ICD-10-CM | POA: Insufficient documentation

## 2016-01-29 DIAGNOSIS — O26879 Cervical shortening, unspecified trimester: Secondary | ICD-10-CM | POA: Insufficient documentation

## 2016-01-29 LAB — POCT URINALYSIS DIP (DEVICE)
BILIRUBIN URINE: NEGATIVE
Glucose, UA: NEGATIVE mg/dL
KETONES UR: NEGATIVE mg/dL
Nitrite: NEGATIVE
PH: 8 (ref 5.0–8.0)
Protein, ur: NEGATIVE mg/dL
Specific Gravity, Urine: 1.02 (ref 1.005–1.030)
Urobilinogen, UA: 0.2 mg/dL (ref 0.0–1.0)

## 2016-01-29 MED ORDER — PROGESTERONE MICRONIZED 200 MG PO CAPS: ORAL_CAPSULE | ORAL | 3 refills | Status: DC

## 2016-01-29 MED ORDER — BETAMETHASONE SOD PHOS & ACET 6 (3-3) MG/ML IJ SUSP
12.0000 mg | Freq: Once | INTRAMUSCULAR | Status: AC
  Administered 2016-01-29: 12 mg via INTRAMUSCULAR

## 2016-01-29 NOTE — Addendum Note (Signed)
Addended by: Kathee DeltonHILLMAN, CARRIE L on: 01/29/2016 05:38 PM   Modules accepted: Orders

## 2016-01-29 NOTE — Progress Notes (Signed)
   PRENATAL VISIT NOTE  Subjective:  Melody Mcclure is a 25 y.o. G1P0 at 7021w0d being seen today for ongoing prenatal care.  She is currently monitored for the following issues for this high-risk pregnancy and has Supervision of high risk pregnancy, antepartum; Bicornate uterus complicating pregnancy; and Short cervix, antepartum on her problem list.  Patient was seen in MFM today for US.  Dynamic cervix seen - ranging from 1.9 to 2.5cm.  MFM recommended course of BMZ and to start prometrium.  Patient reports no bleeding, no contractions and no cramping.  Contractions: Not present. Vag. Bleeding: None.  Movement: Present. Denies leaking of fluid.   The following portions of the patient's history were reviewed and updated as appropriate: allergies, current medications, past family history, past medical history, past social history, past surgical history and problem list. Problem list updated.  Objective:   Vitals:   01/29/16 1052  BP: 110/72  Pulse: 78  Weight: 141 lb (64 kg)    Fetal Status: Fetal Heart Rate (bpm): 144   Movement: Present     General:  Alert, oriented and cooperative. Patient is in no acute distress.  Skin: Skin is warm and dry. No rash noted.   Cardiovascular: Normal heart rate noted  Respiratory: Normal respiratory effort, no problems with respiration noted  Abdomen: Soft, gravid, appropriate for gestational age. Pain/Pressure: Absent     Pelvic:  Cervical exam deferred        Extremities: Normal range of motion.  Edema: None  Mental Status: Normal mood and affect. Normal behavior. Normal judgment and thought content.   Urinalysis:      Assessment and Plan:  Pregnancy: G1P0 at 2921w0d  1. Supervision of high risk pregnancy, antepartum, second trimester FHT and FH normal  2. Short cervix, antepartum, second trimester BMZ today and tomorrow.  Repeat cervical length on 9/22, 9/26, 9/29.  Start prometrium.  3. Need for influenza vaccination - Flu Vaccine QUAD 36+  mos IM (Fluarix, Quad PF)  Preterm labor symptoms and general obstetric precautions including but not limited to vaginal bleeding, contractions, leaking of fluid and fetal movement were reviewed in detail with the patient. Please refer to After Visit Summary for other counseling recommendations.  No Follow-up on file.  Levie HeritageJacob J Kerolos Nehme, DO

## 2016-01-30 ENCOUNTER — Encounter: Payer: Self-pay | Admitting: Obstetrics and Gynecology

## 2016-01-30 ENCOUNTER — Ambulatory Visit (INDEPENDENT_AMBULATORY_CARE_PROVIDER_SITE_OTHER): Payer: Medicaid Other

## 2016-01-30 VITALS — BP 109/72 | HR 85

## 2016-01-30 DIAGNOSIS — O0992 Supervision of high risk pregnancy, unspecified, second trimester: Secondary | ICD-10-CM

## 2016-01-30 DIAGNOSIS — O26872 Cervical shortening, second trimester: Secondary | ICD-10-CM

## 2016-01-30 MED ORDER — BETAMETHASONE SOD PHOS & ACET 6 (3-3) MG/ML IJ SUSP
12.0000 mg | Freq: Once | INTRAMUSCULAR | Status: AC
  Administered 2016-01-30: 12 mg via INTRAMUSCULAR

## 2016-01-30 NOTE — Progress Notes (Signed)
Patient presented to clinic for her second Betamethasone injection. Patient tolerated well and will follow up at her next office visit.

## 2016-01-31 ENCOUNTER — Ambulatory Visit (HOSPITAL_COMMUNITY)
Admission: RE | Admit: 2016-01-31 | Discharge: 2016-01-31 | Disposition: A | Discharge status: Home / Self Care | Payer: Medicaid Other | Source: Ambulatory Visit | Attending: Family Medicine | Admitting: Family Medicine

## 2016-01-31 ENCOUNTER — Other Ambulatory Visit (HOSPITAL_COMMUNITY): Payer: Self-pay | Admitting: Maternal and Fetal Medicine

## 2016-01-31 ENCOUNTER — Encounter (HOSPITAL_COMMUNITY): Payer: Self-pay

## 2016-01-31 DIAGNOSIS — O34592 Maternal care for other abnormalities of gravid uterus, second trimester: Secondary | ICD-10-CM | POA: Insufficient documentation

## 2016-01-31 DIAGNOSIS — Z3A24 24 weeks gestation of pregnancy: Secondary | ICD-10-CM

## 2016-01-31 DIAGNOSIS — O26879 Cervical shortening, unspecified trimester: Secondary | ICD-10-CM

## 2016-01-31 DIAGNOSIS — O3402 Maternal care for unspecified congenital malformation of uterus, second trimester: Secondary | ICD-10-CM

## 2016-01-31 DIAGNOSIS — O26872 Cervical shortening, second trimester: Secondary | ICD-10-CM | POA: Diagnosis present

## 2016-01-31 DIAGNOSIS — Q513 Bicornate uterus: Secondary | ICD-10-CM

## 2016-02-04 ENCOUNTER — Encounter (HOSPITAL_COMMUNITY): Payer: Self-pay

## 2016-02-04 ENCOUNTER — Ambulatory Visit (HOSPITAL_COMMUNITY)
Admission: RE | Admit: 2016-02-04 | Discharge: 2016-02-04 | Disposition: A | Discharge status: Home / Self Care | Payer: Medicaid Other | Source: Ambulatory Visit | Attending: Family Medicine | Admitting: Family Medicine

## 2016-02-04 DIAGNOSIS — O34592 Maternal care for other abnormalities of gravid uterus, second trimester: Secondary | ICD-10-CM | POA: Diagnosis not present

## 2016-02-04 DIAGNOSIS — O26872 Cervical shortening, second trimester: Secondary | ICD-10-CM | POA: Diagnosis present

## 2016-02-04 DIAGNOSIS — Z3A24 24 weeks gestation of pregnancy: Secondary | ICD-10-CM | POA: Diagnosis not present

## 2016-02-04 DIAGNOSIS — Q513 Bicornate uterus: Secondary | ICD-10-CM | POA: Insufficient documentation

## 2016-02-04 DIAGNOSIS — O26879 Cervical shortening, unspecified trimester: Secondary | ICD-10-CM

## 2016-02-07 ENCOUNTER — Ambulatory Visit (HOSPITAL_COMMUNITY)
Admission: RE | Admit: 2016-02-07 | Discharge: 2016-02-07 | Disposition: A | Discharge status: Home / Self Care | Payer: Medicaid Other | Source: Ambulatory Visit | Attending: Family Medicine | Admitting: Family Medicine

## 2016-02-07 ENCOUNTER — Encounter (HOSPITAL_COMMUNITY): Payer: Self-pay

## 2016-02-07 DIAGNOSIS — O34592 Maternal care for other abnormalities of gravid uterus, second trimester: Secondary | ICD-10-CM | POA: Diagnosis present

## 2016-02-07 DIAGNOSIS — O26872 Cervical shortening, second trimester: Secondary | ICD-10-CM | POA: Insufficient documentation

## 2016-02-07 DIAGNOSIS — Z3A25 25 weeks gestation of pregnancy: Secondary | ICD-10-CM | POA: Diagnosis not present

## 2016-02-07 DIAGNOSIS — O26879 Cervical shortening, unspecified trimester: Secondary | ICD-10-CM

## 2016-02-07 DIAGNOSIS — Q513 Bicornate uterus: Secondary | ICD-10-CM | POA: Diagnosis not present

## 2016-02-10 ENCOUNTER — Encounter: Payer: BLUE CROSS/BLUE SHIELD | Admitting: Certified Nurse Midwife

## 2016-02-11 ENCOUNTER — Other Ambulatory Visit (HOSPITAL_COMMUNITY): Payer: Self-pay | Admitting: *Deleted

## 2016-02-11 DIAGNOSIS — O26879 Cervical shortening, unspecified trimester: Secondary | ICD-10-CM

## 2016-02-13 ENCOUNTER — Ambulatory Visit (INDEPENDENT_AMBULATORY_CARE_PROVIDER_SITE_OTHER): Payer: Medicaid Other | Admitting: Family

## 2016-02-13 VITALS — BP 110/69 | HR 84 | Wt 145.8 lb

## 2016-02-13 DIAGNOSIS — O34592 Maternal care for other abnormalities of gravid uterus, second trimester: Secondary | ICD-10-CM

## 2016-02-13 DIAGNOSIS — O3402 Maternal care for unspecified congenital malformation of uterus, second trimester: Secondary | ICD-10-CM

## 2016-02-13 DIAGNOSIS — O26879 Cervical shortening, unspecified trimester: Secondary | ICD-10-CM

## 2016-02-13 DIAGNOSIS — O26872 Cervical shortening, second trimester: Secondary | ICD-10-CM

## 2016-02-13 DIAGNOSIS — O0990 Supervision of high risk pregnancy, unspecified, unspecified trimester: Secondary | ICD-10-CM

## 2016-02-13 DIAGNOSIS — Q513 Bicornate uterus: Secondary | ICD-10-CM

## 2016-02-13 NOTE — Progress Notes (Signed)
   PRENATAL VISIT NOTE  Subjective:  Melody Mcclure is a 25 y.o. G1P0 at 893w1d being seen today for ongoing prenatal care.  She is currently monitored for the following issues for this high-risk pregnancy and has Supervision of high risk pregnancy, antepartum; Bicornate uterus complicating pregnancy; and Short cervix, antepartum on her problem list.  Patient reports no complaints.  Contractions: Not present. Vag. Bleeding: None.  Movement: Present. Denies leaking of fluid.   The following portions of the patient's history were reviewed and updated as appropriate: allergies, current medications, past family history, past medical history, past social history, past surgical history and problem list. Problem list updated.  Objective:   Vitals:   02/13/16 0820  BP: 110/69  Pulse: 84  Weight: 145 lb 12.8 oz (66.1 kg)    Fetal Status: Fetal Heart Rate (bpm): 150 Fundal Height: 27 cm Movement: Present     General:  Alert, oriented and cooperative. Patient is in no acute distress.  Skin: Skin is warm and dry. No rash noted.   Cardiovascular: Normal heart rate noted  Respiratory: Normal respiratory effort, no problems with respiration noted  Abdomen: Soft, gravid, appropriate for gestational age. Pain/Pressure: Absent     Pelvic:  Cervical exam deferred        Extremities: Normal range of motion.  Edema: Trace  Mental Status: Normal mood and affect. Normal behavior. Normal judgment and thought content.   Urinalysis:      Assessment and Plan:  Pregnancy: G1P0 at 173w1d  1. Supervision of high risk pregnancy, antepartum - Reviewed benefits of breastfeeding (concerned about losing weight after delivery - likes the weight gain of pregnancy) - Discussed third trimester labs of next visit  2. Short cervix, antepartum - Continue nightly progesterone  3. Uterus bicornis affecting pregnancy in second trimester - Close monitoring  Preterm labor symptoms and general obstetric precautions  including but not limited to vaginal bleeding, contractions, leaking of fluid and fetal movement were reviewed in detail with the patient. Please refer to After Visit Summary for other counseling recommendations.  Return in about 2 weeks (around 02/27/2016).  Eino FarberWalidah Kennith GainN Karim, CNM

## 2016-02-13 NOTE — Progress Notes (Signed)
Pt to do 28 week labs at next visit.

## 2016-02-13 NOTE — Patient Instructions (Signed)
AREA PEDIATRIC/FAMILY PRACTICE PHYSICIANS  [facility] [address], [address] [place], [place]  [address] Phone - [phone]   Fax - [phone]  [facility] [address] [place], [place] [address] Phone - [phone]   Fax - [phone]  JACK AMOS [address] [place], [place]  [address] Phone - [phone]   Fax - [phone]  [facility] [address] [place], [place]  [address] Phone - [phone]   Fax - [phone]  [facility] [address] [place], [place]  [address] Phone - [phone]   Fax - [phone]  [facility] [address] [place], [place]  [address] Phone - [phone]   Fax - [phone]  [facility] [address] [place], [place]  [address] Phone - [phone]   Fax - [phone]  [facility] [address] [place], [place]  [address] Phone - [phone]   Fax - [phone]  [facility] [address] [place], [place]  [address] Phone - [phone]   Fax - [phone] [facility] [address] [place], [place]  [address] Phone - [phone]   Fax - [phone]  [facility] [address] [place], [place]  [address] Phone - [phone]   Fax - [phone]  [facility] [address] [place], [place]  [address] Phone - [phone]   Fax - [phone]  [facility] [address], [address] [place], [place]  [address] Phone - [phone]   Fax - [phone]  SHILPA GOSRANI [address] [place], [place]  [address] Phone - [phone]  [facility] [address] [place], [place]  [address] Phone - [phone]   Fax - [phone]  [facility] [address] [place], [place]  [address] Phone - [phone]   Fax - [phone]  [facility] [address] [place], [place]  [address] Phone - [phone]   Fax - [phone]  [facility] [address] [place], [place]  [address] Phone - [phone]   Fax - [phone]   [facility] [address] [place], [place]  [address] Phone - [phone]   Fax - [phone]  [facility] [address] [place], [place]  [address] Phone - [phone]   Fax - [phone]  DAVID RUBIN [address] [place], [place]  [address] Phone - [phone]   Fax - [phone]  [facility] [address] [place], [place]  [address] Phone - [phone]   Fax - [phone]  [facility] [address] , [place]  [address] Phone - [phone]   Fax - [phone] [facility] [address] [place], [place]  [address] Phone - [phone]   Fax - [phone]  [facility] [address] [place], [place]  [address] Phone - [phone]   Fax - [phone]  [facility] [address] [place],   [address] Phone - [phone]   Fax - [phone]   

## 2016-02-14 ENCOUNTER — Other Ambulatory Visit (HOSPITAL_COMMUNITY): Payer: Self-pay | Admitting: Maternal and Fetal Medicine

## 2016-02-14 ENCOUNTER — Ambulatory Visit (HOSPITAL_COMMUNITY)
Admission: RE | Admit: 2016-02-14 | Discharge: 2016-02-14 | Disposition: A | Discharge status: Home / Self Care | Payer: Medicaid Other | Source: Ambulatory Visit | Attending: Family Medicine | Admitting: Family Medicine

## 2016-02-14 ENCOUNTER — Encounter (HOSPITAL_COMMUNITY): Payer: Self-pay

## 2016-02-14 DIAGNOSIS — Q513 Bicornate uterus: Secondary | ICD-10-CM | POA: Insufficient documentation

## 2016-02-14 DIAGNOSIS — O26879 Cervical shortening, unspecified trimester: Secondary | ICD-10-CM

## 2016-02-14 DIAGNOSIS — Z3A26 26 weeks gestation of pregnancy: Secondary | ICD-10-CM

## 2016-02-14 DIAGNOSIS — O26872 Cervical shortening, second trimester: Secondary | ICD-10-CM | POA: Insufficient documentation

## 2016-02-14 DIAGNOSIS — O34592 Maternal care for other abnormalities of gravid uterus, second trimester: Secondary | ICD-10-CM | POA: Diagnosis not present

## 2016-02-14 DIAGNOSIS — O3402 Maternal care for unspecified congenital malformation of uterus, second trimester: Secondary | ICD-10-CM

## 2016-02-14 DIAGNOSIS — O0990 Supervision of high risk pregnancy, unspecified, unspecified trimester: Secondary | ICD-10-CM

## 2016-02-17 ENCOUNTER — Other Ambulatory Visit (HOSPITAL_COMMUNITY): Payer: Self-pay | Admitting: *Deleted

## 2016-02-17 DIAGNOSIS — Q513 Bicornate uterus: Principal | ICD-10-CM

## 2016-02-17 DIAGNOSIS — O3400 Maternal care for unspecified congenital malformation of uterus, unspecified trimester: Secondary | ICD-10-CM

## 2016-02-25 ENCOUNTER — Encounter: Payer: Medicaid Other | Admitting: Advanced Practice Midwife

## 2016-03-05 ENCOUNTER — Ambulatory Visit (INDEPENDENT_AMBULATORY_CARE_PROVIDER_SITE_OTHER): Payer: Medicaid Other | Admitting: Obstetrics and Gynecology

## 2016-03-05 ENCOUNTER — Ambulatory Visit (INDEPENDENT_AMBULATORY_CARE_PROVIDER_SITE_OTHER): Payer: Medicaid Other | Admitting: Clinical

## 2016-03-05 VITALS — BP 113/70 | HR 76 | Wt 147.7 lb

## 2016-03-05 DIAGNOSIS — O26879 Cervical shortening, unspecified trimester: Secondary | ICD-10-CM

## 2016-03-05 DIAGNOSIS — F4322 Adjustment disorder with anxiety: Secondary | ICD-10-CM

## 2016-03-05 DIAGNOSIS — Z23 Encounter for immunization: Secondary | ICD-10-CM

## 2016-03-05 DIAGNOSIS — Q513 Bicornate uterus: Secondary | ICD-10-CM | POA: Diagnosis not present

## 2016-03-05 DIAGNOSIS — O0990 Supervision of high risk pregnancy, unspecified, unspecified trimester: Secondary | ICD-10-CM

## 2016-03-05 DIAGNOSIS — O3402 Maternal care for unspecified congenital malformation of uterus, second trimester: Secondary | ICD-10-CM

## 2016-03-05 DIAGNOSIS — O34593 Maternal care for other abnormalities of gravid uterus, third trimester: Secondary | ICD-10-CM | POA: Diagnosis not present

## 2016-03-05 LAB — POCT URINALYSIS DIP (DEVICE)
Bilirubin Urine: NEGATIVE
Glucose, UA: NEGATIVE mg/dL
HGB URINE DIPSTICK: NEGATIVE
Ketones, ur: NEGATIVE mg/dL
NITRITE: NEGATIVE
PH: 7 (ref 5.0–8.0)
PROTEIN: NEGATIVE mg/dL
Specific Gravity, Urine: 1.02 (ref 1.005–1.030)
UROBILINOGEN UA: 0.2 mg/dL (ref 0.0–1.0)

## 2016-03-05 MED ORDER — PRENATAL VITAMINS 0.8 MG PO TABS: 1.0000 | ORAL_TABLET | Freq: Every day | ORAL | 6 refills | Status: DC

## 2016-03-05 MED ORDER — TETANUS-DIPHTH-ACELL PERTUSSIS 5-2.5-18.5 LF-MCG/0.5 IM SUSP
0.5000 mL | Freq: Once | INTRAMUSCULAR | Status: AC
  Administered 2016-03-05: 0.5 mL via INTRAMUSCULAR

## 2016-03-05 NOTE — BH Specialist Note (Signed)
Session Start time: 3:30-4:00   Total Time:  30 minutes Type of Service: Behavioral Health - Individual/Family Interpreter: No.   Interpreter Name & Language: n/a # [facility]June [facility]   SUBJECTIVE: Melody Mcclure is a 25 y.o. female  Pt. was referred by Ernestina PennaNicholas Schenk, MD for:  anxiety. Pt. reports the following symptoms/concerns: Pt feeling an increase in anxiety over uncertainty about balancing first-time motherhood and possibility of graduate school. Duration of problem:  Increase in past month Severity: moderate Previous treatment: none   OBJECTIVE: Mood: Anxious & Affect: Appropriate Risk of harm to self or others: no known risk of harm to self or others Assessments administered: PHQ9: 6/ GAD7: 16   GOALS ADDRESSED:  -Alleviate symptoms of anxiety  INTERVENTIONS: Strength-based and Meditation: CALM relaxation exercise   ASSESSMENT:  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  Pt may benefit from psychoeducation and brief therapeutic intervention regarding coping with anxiety.      PLAN: 1. F/U with behavioral health clinician: One month, or as needed 2. Behavioral Health meds: none 3. Behavioral recommendations:  -Practice daily relaxation exercise in mornings -Read educational material regarding coping with symptoms of anxiety 4. Referral: Brief Counseling/Psychotherapy and Psychoeducation   Rae LipsJamie C Mcmannes LCSWA Behavioral Health Clinician  Marlon PelWarmhandoff:   Warm Hand Off Completed.       Depression screen [facility] 03/05/2016 12/23/2015  Decreased Interest 0 0  Down, Depressed, Hopeless 1 1  PHQ - 2 Score 1 1  Altered sleeping 1 0  Tired, decreased energy 1 0  Change in appetite 2 1  Feeling bad or failure about yourself  1 0  Trouble concentrating 0 0  Moving slowly or fidgety/restless 0 0  Suicidal thoughts 0 0  PHQ-9 Score 6 2    GAD 7 : Generalized Anxiety Score 03/05/2016 12/23/2015  Nervous, Anxious, on  Edge 3 1  Control/stop worrying 3 2  Worry too much - different things 3 2  Trouble relaxing 3 2  Restless 0 0  Easily annoyed or irritable 3 2  Afraid - awful might happen 1 0  Total GAD 7 Score 16 9

## 2016-03-05 NOTE — Patient Instructions (Signed)

## 2016-03-05 NOTE — Progress Notes (Signed)
   PRENATAL VISIT NOTE  Subjective:  Melody Mcclure is a 25 y.o. G1P0 at 2362w1d being seen today for ongoing prenatal care.  She is currently monitored for the following issues for this high-risk pregnancy and has Supervision of high risk pregnancy, antepartum; Bicornate uterus complicating pregnancy; and Short cervix, antepartum on her problem list.  Patient reports no complaints.  Contractions: Irregular. Vag. Bleeding: None.  Movement: Present. Denies leaking of fluid.   The following portions of the patient's history were reviewed and updated as appropriate: allergies, current medications, past family history, past medical history, past social history, past surgical history and problem list. Problem list updated.  Objective:   Vitals:   03/05/16 1511  BP: 113/70  Pulse: 76  Weight: 147 lb 11.2 oz (67 kg)    Fetal Status: Fetal Heart Rate (bpm): 127   Movement: Present     General:  Alert, oriented and cooperative. Patient is in no acute distress.  Skin: Skin is warm and dry. No rash noted.   Cardiovascular: Normal heart rate noted  Respiratory: Normal respiratory effort, no problems with respiration noted  Abdomen: Soft, gravid, appropriate for gestational age. Pain/Pressure: Absent     Pelvic:  Cervical exam deferred        Extremities: Normal range of motion.  Edema: Trace  Mental Status: Normal mood and affect. Normal behavior. Normal judgment and thought content.   Assessment and Plan:  Pregnancy: G1P0 at 3962w1d  1. Supervision of high risk pregnancy, antepartum No Complaints. 28 wk labs today - Prenatal Multivit-Min-Fe-FA (PRENATAL VITAMINS) 0.8 MG tablet; Take 1 tablet by mouth daily. (Patient not taking: Reported on 03/05/2016)  Dispense: 30 tablet; Refill: 6 - CBC - RPR - Glucose Tolerance, 1 HR (50g) - HIV antibody - Tdap (BOOSTRIX) injection 0.5 mL; Inject 0.5 mLs into the muscle once.  2. Short cervix, antepartum Continue progesterone. No longer needs cervical  length, repeat imaging for growth scheduled on 11/3. - Prenatal Multivit-Min-Fe-FA (PRENATAL VITAMINS) 0.8 MG tablet; Take 1 tablet by mouth daily. (Patient not taking: Reported on 03/05/2016)  Dispense: 30 tablet; Refill: 6 - CBC - RPR - Glucose Tolerance, 1 HR (50g) - HIV antibody - Tdap (BOOSTRIX) injection 0.5 mL; Inject 0.5 mLs into the muscle once.  3. Uterus bicornis affecting pregnancy in second trimester  - Prenatal Multivit-Min-Fe-FA (PRENATAL VITAMINS) 0.8 MG tablet; Take 1 tablet by mouth daily. (Patient not taking: Reported on 03/05/2016)  Dispense: 30 tablet; Refill: 6  4 Anxiety Pt is feeling overwhelmed by finishing college, and having a baby. She reports she often just feels overwhelmed or frustrated. She has struggled with anxiety before but coped in unhealthy ways like drinking and partying. She is avoiding those things now because she is pregnant.  Preterm labor symptoms and general obstetric precautions including but not limited to vaginal bleeding, contractions, leaking of fluid and fetal movement were reviewed in detail with the patient. Please refer to After Visit Summary for other counseling recommendations.  Return in about 2 weeks (around 03/19/2016) for HROB.  Lorne SkeensNicholas Michael Schenk, MD

## 2016-03-06 ENCOUNTER — Other Ambulatory Visit: Payer: Self-pay | Admitting: General Practice

## 2016-03-06 LAB — CBC
HEMATOCRIT: 34.8 % — AB (ref 35.0–45.0)
HEMOGLOBIN: 11.2 g/dL — AB (ref 11.7–15.5)
MCH: 23.6 pg — AB (ref 27.0–33.0)
MCHC: 32.2 g/dL (ref 32.0–36.0)
MCV: 73.3 fL — AB (ref 80.0–100.0)
Platelets: 210 10*3/uL (ref 140–400)
RBC: 4.75 MIL/uL (ref 3.80–5.10)
RDW: 14.6 % (ref 11.0–15.0)
WBC: 10.3 10*3/uL (ref 3.8–10.8)

## 2016-03-07 LAB — RPR

## 2016-03-07 LAB — HIV ANTIBODY (ROUTINE TESTING W REFLEX): HIV: NONREACTIVE

## 2016-03-07 LAB — GLUCOSE TOLERANCE, 1 HOUR (50G) W/O FASTING: Glucose, 1 Hr, gestational: 59 mg/dL — ABNORMAL LOW (ref <140)

## 2016-03-09 ENCOUNTER — Encounter (HOSPITAL_COMMUNITY): Payer: Self-pay

## 2016-03-09 ENCOUNTER — Inpatient Hospital Stay (HOSPITAL_COMMUNITY)
Admission: AD | Admit: 2016-03-09 | Discharge: 2016-03-09 | Disposition: A | Discharge status: Home / Self Care | Payer: Medicaid Other | Source: Ambulatory Visit | Attending: Family Medicine | Admitting: Family Medicine

## 2016-03-09 DIAGNOSIS — O4703 False labor before 37 completed weeks of gestation, third trimester: Secondary | ICD-10-CM | POA: Diagnosis not present

## 2016-03-09 DIAGNOSIS — R109 Unspecified abdominal pain: Secondary | ICD-10-CM

## 2016-03-09 DIAGNOSIS — O26899 Other specified pregnancy related conditions, unspecified trimester: Secondary | ICD-10-CM

## 2016-03-09 DIAGNOSIS — Z3A29 29 weeks gestation of pregnancy: Secondary | ICD-10-CM | POA: Insufficient documentation

## 2016-03-09 DIAGNOSIS — Z87891 Personal history of nicotine dependence: Secondary | ICD-10-CM | POA: Insufficient documentation

## 2016-03-09 DIAGNOSIS — R1031 Right lower quadrant pain: Secondary | ICD-10-CM | POA: Diagnosis present

## 2016-03-09 DIAGNOSIS — O3402 Maternal care for unspecified congenital malformation of uterus, second trimester: Secondary | ICD-10-CM

## 2016-03-09 DIAGNOSIS — Q513 Bicornate uterus: Secondary | ICD-10-CM | POA: Diagnosis not present

## 2016-03-09 DIAGNOSIS — O3403 Maternal care for unspecified congenital malformation of uterus, third trimester: Secondary | ICD-10-CM | POA: Insufficient documentation

## 2016-03-09 DIAGNOSIS — O99513 Diseases of the respiratory system complicating pregnancy, third trimester: Secondary | ICD-10-CM | POA: Diagnosis not present

## 2016-03-09 DIAGNOSIS — O0990 Supervision of high risk pregnancy, unspecified, unspecified trimester: Secondary | ICD-10-CM

## 2016-03-09 DIAGNOSIS — J069 Acute upper respiratory infection, unspecified: Secondary | ICD-10-CM | POA: Insufficient documentation

## 2016-03-09 DIAGNOSIS — R102 Pelvic and perineal pain: Secondary | ICD-10-CM

## 2016-03-09 LAB — CBC
HEMATOCRIT: 31.5 % — AB (ref 36.0–46.0)
Hemoglobin: 10.6 g/dL — ABNORMAL LOW (ref 12.0–15.0)
MCH: 24.3 pg — ABNORMAL LOW (ref 26.0–34.0)
MCHC: 33.7 g/dL (ref 30.0–36.0)
MCV: 72.2 fL — AB (ref 78.0–100.0)
PLATELETS: 174 10*3/uL (ref 150–400)
RBC: 4.36 MIL/uL (ref 3.87–5.11)
RDW: 15.4 % (ref 11.5–15.5)
WBC: 8.5 10*3/uL (ref 4.0–10.5)

## 2016-03-09 LAB — WET PREP, GENITAL
CLUE CELLS WET PREP: NONE SEEN
SPERM: NONE SEEN
TRICH WET PREP: NONE SEEN
YEAST WET PREP: NONE SEEN

## 2016-03-09 LAB — URINALYSIS, ROUTINE W REFLEX MICROSCOPIC
BILIRUBIN URINE: NEGATIVE
GLUCOSE, UA: NEGATIVE mg/dL
HGB URINE DIPSTICK: NEGATIVE
Ketones, ur: NEGATIVE mg/dL
Nitrite: NEGATIVE
PROTEIN: NEGATIVE mg/dL
SPECIFIC GRAVITY, URINE: 1.015 (ref 1.005–1.030)
pH: 8.5 — ABNORMAL HIGH (ref 5.0–8.0)

## 2016-03-09 LAB — URINE MICROSCOPIC-ADD ON: RBC / HPF: NONE SEEN RBC/hpf (ref 0–5)

## 2016-03-09 LAB — FETAL FIBRONECTIN: Fetal Fibronectin: NEGATIVE

## 2016-03-09 MED ORDER — NIFEDIPINE 10 MG PO CAPS
10.0000 mg | ORAL_CAPSULE | ORAL | Status: DC | PRN
Start: 2016-03-09 — End: 2016-03-09
  Administered 2016-03-09: 10 mg via ORAL
  Filled 2016-03-09: qty 1

## 2016-03-09 MED ORDER — ACETAMINOPHEN 500 MG PO TABS
1000.0000 mg | ORAL_TABLET | Freq: Once | ORAL | Status: AC
  Administered 2016-03-09: 1000 mg via ORAL
  Filled 2016-03-09: qty 2

## 2016-03-09 NOTE — MAU Note (Signed)
Been cramping last 2 days, getting more intense.  Also has a cold and has been coughing, causing pain in RLQ, doesn't really go away

## 2016-03-09 NOTE — MAU Provider Note (Signed)
Chief Complaint:  Abdominal Cramping   First Provider Initiated Contact with Patient 03/09/16 1018      HPI: Melody Mcclure is a 25 y.o. G1P0 at [redacted]w[redacted]d who presents to maternity admissions reporting onset of dull right lower quadrant abdominal pain this morning.  The pain is new onset, constant, unchanged.  She has tried position change and heat which have not helped. 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  She denies constipation or diarrhea. She denies n/v or fever/chills.  She does not feel any cramping/contractions, no intermittent abdominal pain at all.  She is using vaginal progesterone nightly for shortened cervix and had BMZ last month for risk of preterm delivery. She reports good fetal movement, denies LOF, vaginal bleeding, vaginal itching/burning, urinary symptoms, h/a, or dizziness.  HPI  Past Medical History: Past Medical History:  Diagnosis Date  . 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 History of syncope    last episode 2014  . Palpitations   . SOB (shortness of breath)     Past obstetric history: OB History  Gravida Para Term Preterm AB Living  1         0  SAB TAB Ectopic Multiple Live Births               # Outcome Date GA Lbr Len/2nd Weight Sex Delivery Anes PTL Lv  1 Current               Past Surgical History: Past Surgical History:  Procedure Laterality Date  . NO PAST SURGERIES      Family History: Family History  Problem Relation Age of Onset  . Asthma Brother   . Cancer Paternal Grandfather     Lung cancer    Social History: Social History  Substance Use Topics  . Smoking status: Former Games developer  . Smokeless tobacco: Never Used  . Alcohol use 0.0 oz/week     Comment: not every week - social    Allergies: No Known Allergies  Meds:  Prescriptions Prior to Admission  Medication Sig Dispense Refill Last Dose  . Prenatal Multivit-Min-Fe-FA (PRENATAL VITAMINS) 0.8 MG tablet Take 1 tablet by mouth daily.  (Patient not taking: Reported on 03/05/2016) 30 tablet 6 Not Taking  . progesterone (PROMETRIUM) 200 MG capsule Place one capsule vaginally at bedtime 30 capsule 3 Taking    ROS:  Review of Systems  Constitutional: Negative for chills, fatigue and fever.  Eyes: Negative for visual disturbance.  Respiratory: Negative for shortness of breath.   Cardiovascular: Negative for chest pain.  Gastrointestinal: Negative for abdominal pain, nausea and vomiting.  Genitourinary: Negative for difficulty urinating, dysuria, flank pain, pelvic pain, vaginal bleeding, vaginal discharge and vaginal pain.  Neurological: Negative for dizziness and headaches.  Psychiatric/Behavioral: Negative.      I have reviewed patient's Past Medical Hx, Surgical Hx, Family Hx, Social Hx, medications and allergies.   Physical Exam   Patient Vitals for the past 24 hrs:  BP Temp Temp src Pulse Resp SpO2 Height Weight  03/09/16 1131 - - - 75 - 100 % - -  03/09/16 1100 - - - 77 - 100 % - -  03/09/16 1045 - - - 70 - 97 % - -  03/09/16 1038 - - - 76 - 99 % - -  03/09/16 1033 - - - 80 - 100 % - -  03/09/16 1030 - - - 79 - 100 % - -  03/09/16 1018 - - - 95 - 98 % - -  03/09/16 1013 - - - 81 - 96 % - -  03/09/16 1008 - - - 97 - 94 % - -  03/09/16 1003 - - - 84 - 98 % - -  03/09/16 0941 - - - - - - 5' 7.5" (1.715 m) 147 lb 4 oz (66.8 kg)  03/09/16 0922 108/71 98.1 F (36.7 C) Oral 90 16 - - 147 lb 4 oz (66.8 kg)   Constitutional: Well-developed, well-nourished female in no acute distress.  Cardiovascular: normal rate Respiratory: normal effort GI: Abd soft, non-tender, gravid appropriate for gestational age.  MS: Extremities nontender, no edema, normal ROM Neurologic: Alert and oriented x 4.  GU: Neg CVAT.  PELVIC EXAM: Cervix pink, visually closed, without lesion, small amount white clumpy discharge (prometrium vs yeast), vaginal walls and external genitalia normal Cervix 0/50/high, firm, anterior, neg  CMT  Dilation: Closed Effacement (%): 50 Exam by:: Maahir Horst leftwich-kirby,cnm  FHT:  Baseline 130 , moderate variability, accelerations present, no decelerations Contractions: q 5-8 mins, mild to palpation   Labs: Results for orders placed or performed during the hospital encounter of 03/09/16 (from the past 24 hour(s))  Urinalysis, Routine w reflex microscopic ([facility])     Status: Abnormal   Collection Time: 03/09/16  9:33 AM  Result Value Ref Range   Color, Urine YELLOW YELLOW   APPearance CLEAR CLEAR   Specific Gravity, Urine 1.015 1.005 - 1.030   pH 8.5 (H) 5.0 - 8.0   Glucose, UA NEGATIVE NEGATIVE mg/dL   Hgb urine dipstick NEGATIVE NEGATIVE   Bilirubin Urine NEGATIVE NEGATIVE   Ketones, ur NEGATIVE NEGATIVE mg/dL   Protein, ur NEGATIVE NEGATIVE mg/dL   Nitrite NEGATIVE NEGATIVE   Leukocytes, UA TRACE (A) NEGATIVE  Urine microscopic-add on     Status: Abnormal   Collection Time: 03/09/16  9:33 AM  Result Value Ref Range   Squamous Epithelial / LPF 0-5 (A) NONE SEEN   WBC, UA 0-5 0 - 5 WBC/hpf   RBC / HPF NONE SEEN 0 - 5 RBC/hpf   Bacteria, UA FEW (A) NONE SEEN  Fetal fibronectin     Status: None   Collection Time: 03/09/16 10:25 AM  Result Value Ref Range   Fetal Fibronectin NEGATIVE NEGATIVE  Wet prep, genital     Status: Abnormal   Collection Time: 03/09/16 10:25 AM  Result Value Ref Range   Yeast Wet Prep HPF POC NONE SEEN NONE SEEN   Trich, Wet Prep NONE SEEN NONE SEEN   Clue Cells Wet Prep HPF POC NONE SEEN NONE SEEN   WBC, Wet Prep HPF POC FEW (A) NONE SEEN   Sperm NONE SEEN    O/POS/-- (07/05 1003)  Imaging:  [facility] Ob Transvaginal  Result Date: 02/14/2016 OBSTETRICAL ULTRASOUND: This exam was performed within a [facility]. The OB US report was generated in the AS system, and faxed to the ordering physician.  This report is available in the [employer] PACS. See the AS Obstetric US report via the Image Link.   MAU Course/MDM: I  have ordered labs and reviewed results.  NST reviewed. Overall Category I with one isolated variable deceleration with contraction Negative FFN today Consult [facility] with presentation, exam findings and test results.  RLQ pain likely round ligament/musculoskeltal pain, possibly aggravated by coughing and/or lifting children at her job Cramping/contractions became more uncomfortable while pt in MAU, still occurring 5-8 minutes apart.  Procardia 10 mg x 3 doses PRN   Rest/ice/heat/warm bath/Tylenol/pregnancy support belt for pain Pt given list of safe OTC medications in pregnancy to treat URI symptoms Pt stable at time of discharge.  Today's evaluation included a work-up for preterm labor which can be life-threatening for both mom and baby.  Assessment: 1. Pain of round ligament affecting pregnancy, antepartum   2. Supervision of high risk pregnancy, antepartum   3. 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 Acute upper respiratory infection   5. Abdominal pain affecting pregnancy   6. Threatened preterm labor, third trimester     Plan: Discharge home Preterm labor precautions and fetal kick counts  Follow-up Information    [facility]   Specialty:  Obstetrics and Gynecology Why:  As scheduled, return to MAU as needed for emergencies Contact information: [address] [place] [id number] [phone]           Medication List    TAKE these medications   Prenatal Vitamins 0.8 MG tablet Take 1 tablet by mouth daily.   progesterone 200 MG capsule Commonly known as:  PROMETRIUM Place one capsule vaginally at bedtime       Sharen CounterLisa Leftwich-Kirby Certified Nurse-Midwife 03/09/2016 11:58 AM

## 2016-03-09 NOTE — Discharge Instructions (Signed)
Round Ligament Pain The round ligament is a cord of muscle and tissue that helps to support the uterus. It can become a source of pain during pregnancy if it becomes stretched or twisted as the baby grows. The pain usually begins in the second trimester of pregnancy, and it can come and go until the baby is delivered. It is not a serious problem, and it does not cause harm to the baby. Round ligament pain is usually a short, sharp, and pinching pain, but it can also be a dull, lingering, and aching pain. The pain is felt in the lower side of the abdomen or in the groin. It usually starts deep in the groin and moves up to the outside of the hip area. Pain can occur with:  A sudden change in position.  Rolling over in bed.  Coughing or sneezing.  Physical activity. HOME CARE INSTRUCTIONS Watch your condition for any changes. Take these steps to help with your pain:  When the pain starts, relax. Then try:  Sitting down.  Flexing your knees up to your abdomen.  Lying on your side with one pillow under your abdomen and another pillow between your legs.  Sitting in a warm bath for 15-20 minutes or until the pain goes away.  Take over-the-counter and prescription medicines only as told by your health care provider.  Move slowly when you sit and stand.  Avoid long walks if they cause pain.  Stop or lessen your physical activities if they cause pain. SEEK MEDICAL CARE IF:  Your pain does not go away with treatment.  You feel pain in your back that you did not have before.  Your medicine is not helping. SEEK IMMEDIATE MEDICAL CARE IF:  You develop a fever or chills.  You develop uterine contractions.  You develop vaginal bleeding.  You develop nausea or vomiting.  You develop diarrhea.  You have pain when you urinate.   This information is not intended to replace advice given to you by your health care provider. Make sure you discuss any questions you have with your health  care provider.   Document Released: 02/04/2008 Document Revised: 07/20/2011 Document Reviewed: 07/04/2014 Elsevier Interactive Patient Education 2016 Elsevier Inc. Viral Infections A viral infection can be caused by different types of viruses.Most viral infections are not serious and resolve on their own. However, some infections may cause severe symptoms and may lead to further complications. SYMPTOMS Viruses can frequently cause:  Minor sore throat.  Aches and pains.  Headaches.  Runny nose.  Different types of rashes.  Watery eyes.  Tiredness.  Cough.  Loss of appetite.  Gastrointestinal infections, resulting in nausea, vomiting, and diarrhea. These symptoms do not respond to antibiotics because the infection is not caused by bacteria. However, you might catch a bacterial infection following the viral infection. This is sometimes called a "superinfection." Symptoms of such a bacterial infection may include:  Worsening sore throat with pus and difficulty swallowing.  Swollen neck glands.  Chills and a high or persistent fever.  Severe headache.  Tenderness over the sinuses.  Persistent overall ill feeling (malaise), muscle aches, and tiredness (fatigue).  Persistent cough.  Yellow, green, or brown mucus production with coughing. HOME CARE INSTRUCTIONS   Only take over-the-counter or prescription medicines for pain, discomfort, diarrhea, or fever as directed by your caregiver.  Drink enough water and fluids to keep your urine clear or pale yellow. Sports drinks can provide valuable electrolytes, sugars, and hydration.  Get plenty of  rest and maintain proper nutrition. Soups and broths with crackers or rice are fine. SEEK IMMEDIATE MEDICAL CARE IF:   You have severe headaches, shortness of breath, chest pain, neck pain, or an unusual rash.  You have uncontrolled vomiting, diarrhea, or you are unable to keep down fluids.  You or your child has an oral  temperature above 102 F (38.9 C), not controlled by medicine.  Your baby is older than 3 months with a rectal temperature of 102 F (38.9 C) or higher.  Your baby is 633 months old or younger with a rectal temperature of 100.4 F (38 C) or higher. MAKE SURE YOU:   Understand these instructions.  Will watch your condition.  Will get help right away if you are not doing well or get worse.   This information is not intended to replace advice given to you by your health care provider. Make sure you discuss any questions you have with your health care provider.   Document Released: 02/04/2005 Document Revised: 07/20/2011 Document Reviewed: 10/03/2014 Elsevier Interactive Patient Education [employer]2016 Elsevier Inc.

## 2016-03-10 LAB — GC/CHLAMYDIA PROBE AMP (~~LOC~~) NOT AT ARMC
CHLAMYDIA, DNA PROBE: NEGATIVE
NEISSERIA GONORRHEA: NEGATIVE

## 2016-03-12 ENCOUNTER — Encounter (HOSPITAL_COMMUNITY): Payer: Self-pay

## 2016-03-12 ENCOUNTER — Inpatient Hospital Stay (HOSPITAL_COMMUNITY)
Admission: AD | Admit: 2016-03-12 | Discharge: 2016-03-12 | Disposition: A | Discharge status: Home / Self Care | Payer: Medicaid Other | Source: Ambulatory Visit | Attending: Obstetrics and Gynecology | Admitting: Obstetrics and Gynecology

## 2016-03-12 DIAGNOSIS — Z87891 Personal history of nicotine dependence: Secondary | ICD-10-CM | POA: Insufficient documentation

## 2016-03-12 DIAGNOSIS — O0990 Supervision of high risk pregnancy, unspecified, unspecified trimester: Secondary | ICD-10-CM

## 2016-03-12 DIAGNOSIS — Z3A30 30 weeks gestation of pregnancy: Secondary | ICD-10-CM | POA: Insufficient documentation

## 2016-03-12 DIAGNOSIS — Q513 Bicornate uterus: Secondary | ICD-10-CM

## 2016-03-12 DIAGNOSIS — O3402 Maternal care for unspecified congenital malformation of uterus, second trimester: Secondary | ICD-10-CM

## 2016-03-12 DIAGNOSIS — O4703 False labor before 37 completed weeks of gestation, third trimester: Secondary | ICD-10-CM

## 2016-03-12 DIAGNOSIS — Z3689 Encounter for other specified antenatal screening: Secondary | ICD-10-CM

## 2016-03-12 LAB — URINALYSIS, ROUTINE W REFLEX MICROSCOPIC
Bilirubin Urine: NEGATIVE
Glucose, UA: NEGATIVE mg/dL
Hgb urine dipstick: NEGATIVE
Ketones, ur: NEGATIVE mg/dL
NITRITE: NEGATIVE
Protein, ur: NEGATIVE mg/dL
SPECIFIC GRAVITY, URINE: 1.015 (ref 1.005–1.030)
pH: 7 (ref 5.0–8.0)

## 2016-03-12 LAB — URINE MICROSCOPIC-ADD ON: RBC / HPF: NONE SEEN RBC/hpf (ref 0–5)

## 2016-03-12 MED ORDER — NIFEDIPINE 10 MG PO CAPS
10.0000 mg | ORAL_CAPSULE | Freq: Once | ORAL | Status: AC
  Administered 2016-03-12: 10 mg via ORAL
  Filled 2016-03-12: qty 1

## 2016-03-12 MED ORDER — LACTATED RINGERS IV BOLUS (SEPSIS)
1000.0000 mL | Freq: Once | INTRAVENOUS | Status: AC
  Administered 2016-03-12: 1000 mL via INTRAVENOUS

## 2016-03-12 MED ORDER — NIFEDIPINE 10 MG PO CAPS
10.0000 mg | ORAL_CAPSULE | Freq: Three times a day (TID) | ORAL | 0 refills | Status: DC
Start: 2016-03-12 — End: 2016-03-18

## 2016-03-12 NOTE — Discharge Instructions (Signed)
Braxton Hicks Contractions °Contractions of the uterus can occur throughout pregnancy. Contractions are not always a sign that you are in labor.  °WHAT ARE BRAXTON HICKS CONTRACTIONS?  °Contractions that occur before labor are called Braxton Hicks contractions, or false labor. Toward the end of pregnancy (32-34 weeks), these contractions can develop more often and may become more forceful. This is not true labor because these contractions do not result in opening (dilatation) and thinning of the cervix. They are sometimes difficult to tell apart from true labor because these contractions can be forceful and people have different pain tolerances. You should not feel embarrassed if you go to the hospital with false labor. Sometimes, the only way to tell if you are in true labor is for your health care provider to look for changes in the cervix. °If there are no prenatal problems or other health problems associated with the pregnancy, it is completely safe to be sent home with false labor and await the onset of true labor. °HOW CAN YOU TELL THE DIFFERENCE BETWEEN TRUE AND FALSE LABOR? °False Labor °· The contractions of false labor are usually shorter and not as hard as those of true labor.   °· The contractions are usually irregular.   °· The contractions are often felt in the front of the lower abdomen and in the groin.   °· The contractions may go away when you walk around or change positions while lying down.   °· The contractions get weaker and are shorter lasting as time goes on.   °· The contractions do not usually become progressively stronger, regular, and closer together as with true labor.   °True Labor °· Contractions in true labor last 30-70 seconds, become very regular, usually become more intense, and increase in frequency.   °· The contractions do not go away with walking.   °· The discomfort is usually felt in the top of the uterus and spreads to the lower abdomen and low back.   °· True labor can be  determined by your health care provider with an exam. This will show that the cervix is dilating and getting thinner.   °WHAT TO REMEMBER °· Keep up with your usual exercises and follow other instructions given by your health care provider.   °· Take medicines as directed by your health care provider.   °· Keep your regular prenatal appointments.   °· Eat and drink lightly if you think you are going into labor.   °· If Braxton Hicks contractions are making you uncomfortable:   °¨ Change your position from lying down or resting to walking, or from walking to resting.   °¨ Sit and rest in a tub of warm water.   °¨ Drink 2-3 glasses of water. Dehydration may cause these contractions.   °¨ Do slow and deep breathing several times an hour.   °WHEN SHOULD I SEEK IMMEDIATE MEDICAL CARE? °Seek immediate medical care if: °· Your contractions become stronger, more regular, and closer together.   °· You have fluid leaking or gushing from your vagina.   °· You have a fever.   °· You pass blood-tinged mucus.   °· You have vaginal bleeding.   °· You have continuous abdominal pain.   °· You have low back pain that you never had before.   °· You feel your baby's head pushing down and causing pelvic pressure.   °· Your baby is not moving as much as it used to.   °  °This information is not intended to replace advice given to you by your health care provider. Make sure you discuss any questions you have with your health care   provider. °  °Document Released: 04/27/2005 Document Revised: 05/02/2013 Document Reviewed: 02/06/2013 °Elsevier Interactive Patient Education ©2016 [employer] ° °

## 2016-03-12 NOTE — MAU Provider Note (Signed)
History     CSN: [id number]  Arrival date and time: 03/12/16 1014   First Provider Initiated Contact with Patient 03/12/16 1109         Chief Complaint  Patient presents with  . Abdominal Pain  . Loss of Consciousness   HPI  Pt is a 25 yo G1P0 at 2672w1d presenting with recurring pre-term contractions. 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 Was evaluated in MAU three days ago for round ligament pain and sent home with instructions to continue prometrium. Today she presents with continuing contractions, but no vaginal leaking, discharge, or bleeding.Reports that she has not been hydrating adequately and that her appetite has significantly diminished, and she has to "force myself to eat."  No N/V, no other pain.           OB History    Gravida Para Term Preterm AB Living   1         0   SAB TAB Ectopic Multiple Live Births                      Past Medical History:  Diagnosis Date  . 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 History of syncope    last episode 2014  . Palpitations   . SOB (shortness of breath)          Past Surgical History:  Procedure Laterality Date  . NO PAST SURGERIES            Family History  Problem Relation Age of Onset  . Asthma Brother   . Cancer Paternal Grandfather     Lung cancer    Social History   Substance Use Topics   . Smoking status: Former Games developermoker   . Smokeless tobacco: Never Used   . Alcohol use 0.0 oz/week     Comment: not every week - social     Allergies: No Known Allergies         Prescriptions Prior to Admission  Medication Sig Dispense Refill Last Dose  . Prenatal Multivit-Min-Fe-FA (PRENATAL VITAMINS) 0.8 MG tablet Take 1 tablet by mouth daily. (Patient not taking: Reported on 03/05/2016) 30 tablet 6 Not Taking  . progesterone (PROMETRIUM) 200 MG capsule Place one capsule vaginally at bedtime 30 capsule 3 Taking    Review of Systems  Constitutional: Negative for chills and fever.   Gastrointestinal: Negative for constipation, diarrhea, nausea and vomiting.  Musculoskeletal: Negative for myalgias.   Physical Exam   Blood pressure 114/73, pulse 93, temperature 97.7 F (36.5 C), temperature source Oral, resp. rate 18, last menstrual period 07/24/2015.  Physical Exam  Constitutional: She is oriented to person, place, and time. She appears well-developed and well-nourished.  Cardiovascular: Normal rate, regular rhythm and normal heart sounds.  Exam reveals no gallop and no friction rub.   No murmur heard. Respiratory: Effort normal and breath sounds normal. No respiratory distress.  GI: Soft.  Gravid  Genitourinary: Vagina normal.  Genitourinary Comments: Cervix closed, 40% effaced, -2 station  Neurological: She is alert and oriented to person, place, and time. She has normal reflexes.  Skin: Skin is warm and dry.  Psychiatric: She has a normal mood and affect. Her behavior is normal.   EFM: 135 bpm, mod variability, + accels, no decels Toco: 2-6 Results for orders placed or performed during the hospital encounter of 03/12/16 (from the past 24 hour(s))  Urinalysis, Routine w reflex microscopic (not [facility])     Status: Abnormal   Collection  Time: 03/12/16 10:15 AM  Result Value Ref Range   Color, Urine YELLOW YELLOW   APPearance HAZY (A) CLEAR   Specific Gravity, Urine 1.015 1.005 - 1.030   pH 7.0 5.0 - 8.0   Glucose, UA NEGATIVE NEGATIVE mg/dL   Hgb urine dipstick NEGATIVE NEGATIVE   Bilirubin Urine NEGATIVE NEGATIVE   Ketones, ur NEGATIVE NEGATIVE mg/dL   Protein, ur NEGATIVE NEGATIVE mg/dL   Nitrite NEGATIVE NEGATIVE   Leukocytes, UA SMALL (A) NEGATIVE  Urine microscopic-add on     Status: Abnormal   Collection Time: 03/12/16 10:15 AM  Result Value Ref Range   Squamous Epithelial / LPF 6-30 (A) NONE SEEN   WBC, UA 0-5 0 - 5 WBC/hpf   RBC / HPF NONE SEEN 0 - 5 RBC/hpf   Bacteria, UA MANY (A) NONE SEEN   Urine-Other AMORPHOUS URATES/PHOSPHATES      MAU Course  Procedures IVF bolus Procardia  MDM 1200: care assumed from Dr. Alysia PennaErvin.  Labs ordered and reviewed. No evidence of UTI or PTL. Ctx improved after IVF and Procardia. Stable for discharge home.    Assessment and Plan   1. Preterm uterine contractions in third trimester, antepartum   2. Supervision of high risk pregnancy, antepartum   3. 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 NST (non-stress test) reactive    Discharge home Out of work/school until next week Follow up as scheduled with MFM tomorrow Follow up as scheduled in [facility] next week PTL precautions    Medication List    TAKE these medications   NIFEdipine 10 MG capsule Commonly known as:  PROCARDIA Take 1 capsule (10 mg total) by mouth every 8 (eight) hours.   prenatal multivitamin Tabs tablet Take 1 tablet by mouth at bedtime.   progesterone 200 MG capsule Commonly known as:  PROMETRIUM Place 200 mg vaginally at bedtime.      Donette LarryMelanie Harmon Bommarito, CNM  03/12/2016 3:00 PM

## 2016-03-12 NOTE — MAU Provider Note (Deleted)
History     CSN: [id number]  Arrival date and time: 03/12/16 1014   First Provider Initiated Contact with Patient 03/12/16 1109      Chief Complaint  Patient presents with  . Abdominal Pain  . Loss of Consciousness   HPI  Pt is a 25 yo G1P0 at 1637w1d presenting with recurring pre-term contractions. 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 Was evaluated in MAU three days ago for round ligament pain and sent home with instructions to continue prometrium. Today she presents with continuing contractions, but no vaginal leaking, discharge, or bleeding.Reports that she has not been hydrating adequately and that her appetite has significantly diminished, and she has to "force myself to eat."  No N/V, no other pain. She is scheduled for an ultrasound tomorrow at 10:40.   OB History    Gravida Para Term Preterm AB Living   1         0   SAB TAB Ectopic Multiple Live Births                  Past Medical History:  Diagnosis Date  . 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 History of syncope    last episode 2014  . Palpitations   . SOB (shortness of breath)     Past Surgical History:  Procedure Laterality Date  . NO PAST SURGERIES      Family History  Problem Relation Age of Onset  . Asthma Brother   . Cancer Paternal Grandfather     Lung cancer    Social History  Substance Use Topics  . Smoking status: Former Games developermoker  . Smokeless tobacco: Never Used  . Alcohol use 0.0 oz/week     Comment: not every week - social    Allergies: No Known Allergies  Prescriptions Prior to Admission  Medication Sig Dispense Refill Last Dose  . Prenatal Multivit-Min-Fe-FA (PRENATAL VITAMINS) 0.8 MG tablet Take 1 tablet by mouth daily. (Patient not taking: Reported on 03/05/2016) 30 tablet 6 Not Taking  . progesterone (PROMETRIUM) 200 MG capsule Place one capsule vaginally at bedtime 30 capsule 3 Taking    Review of Systems  Constitutional: Negative for chills and fever.  Gastrointestinal:  Negative for constipation, diarrhea, nausea and vomiting.  Musculoskeletal: Negative for myalgias.   Physical Exam   Blood pressure 114/73, pulse 93, temperature 97.7 F (36.5 C), temperature source Oral, resp. rate 18, last menstrual period 07/24/2015.  Physical Exam  Constitutional: She is oriented to person, place, and time. She appears well-developed and well-nourished.  Cardiovascular: Normal rate, regular rhythm and normal heart sounds.  Exam reveals no gallop and no friction rub.   No murmur heard. Respiratory: Effort normal and breath sounds normal. No respiratory distress.  GI: Soft.  Gravid  Genitourinary: Vagina normal.  Genitourinary Comments: Cervix closed, 40% effaced, -2 station  Neurological: She is alert and oriented to person, place, and time. She has normal reflexes.  Skin: Skin is warm and dry.  Psychiatric: She has a normal mood and affect. Her behavior is normal.    MAU Course  Procedures  MDM Presenting with continuing contractions. Cervix unchanged from last visit, still closed, less than 50% effaced, -2 station. Contractions 3-5 minutes apart, not causing acute maternal or fetal distress. Category I FHR, 150 BPM baseline with moderate variability. Pt has already received steroids earlier in the pregnancy. Discussed importance of hydration, nutrition, and rest, and possibility of having to take an LOA from either work or school due to  high-risk pregnancy.   Assessment and Plan  1. Pre-term contractions (30w) without labor  -- Prometrium as prescribed  -- 10 mg procardia 2. Dehydration  -- IV LRs  Davionne Dowty E Lewis-Bevan 03/12/2016, 11:37 AM

## 2016-03-12 NOTE — MAU Note (Addendum)
Pt was in MAU on Monday for pulled muscle, had some contractions while here.  Was given medication for contractions.  Continues to have abdominal tightening, became more frequent last night.  Denies bleeding or LOF.  Became lightheaded & dizzy this morning, fainted. Hasn't eaten or drank anything this morning. Hx of short cervix.

## 2016-03-13 ENCOUNTER — Encounter (HOSPITAL_COMMUNITY): Payer: Self-pay

## 2016-03-13 ENCOUNTER — Ambulatory Visit (HOSPITAL_COMMUNITY)
Admission: RE | Admit: 2016-03-13 | Discharge: 2016-03-13 | Disposition: A | Discharge status: Home / Self Care | Payer: Medicaid Other | Source: Ambulatory Visit | Attending: Women's Health | Admitting: Women's Health

## 2016-03-13 ENCOUNTER — Other Ambulatory Visit (HOSPITAL_COMMUNITY): Payer: Self-pay | Admitting: Maternal and Fetal Medicine

## 2016-03-13 ENCOUNTER — Other Ambulatory Visit (HOSPITAL_COMMUNITY): Payer: Self-pay | Admitting: *Deleted

## 2016-03-13 DIAGNOSIS — Q513 Bicornate uterus: Principal | ICD-10-CM

## 2016-03-13 DIAGNOSIS — O3400 Maternal care for unspecified congenital malformation of uterus, unspecified trimester: Secondary | ICD-10-CM

## 2016-03-13 DIAGNOSIS — Z3A30 30 weeks gestation of pregnancy: Secondary | ICD-10-CM

## 2016-03-13 DIAGNOSIS — O34593 Maternal care for other abnormalities of gravid uterus, third trimester: Secondary | ICD-10-CM

## 2016-03-13 DIAGNOSIS — O0990 Supervision of high risk pregnancy, unspecified, unspecified trimester: Secondary | ICD-10-CM

## 2016-03-13 DIAGNOSIS — O3402 Maternal care for unspecified congenital malformation of uterus, second trimester: Secondary | ICD-10-CM

## 2016-03-14 ENCOUNTER — Encounter (HOSPITAL_COMMUNITY): Payer: Self-pay | Admitting: *Deleted

## 2016-03-14 ENCOUNTER — Inpatient Hospital Stay (HOSPITAL_COMMUNITY)
Admission: AD | Admit: 2016-03-14 | Discharge: 2016-03-17 | DRG: 781 | Disposition: A | Discharge status: Home / Self Care | Payer: Medicaid Other | Source: Ambulatory Visit | Attending: Obstetrics and Gynecology | Admitting: Obstetrics and Gynecology

## 2016-03-14 ENCOUNTER — Inpatient Hospital Stay (HOSPITAL_COMMUNITY): Payer: Medicaid Other

## 2016-03-14 ENCOUNTER — Inpatient Hospital Stay (HOSPITAL_COMMUNITY)
Admission: AD | Admit: 2016-03-14 | Discharge: 2016-03-14 | Discharge status: Absent without leave | Payer: Self-pay | Attending: Obstetrics and Gynecology | Admitting: Obstetrics and Gynecology

## 2016-03-14 DIAGNOSIS — O2303 Infections of kidney in pregnancy, third trimester: Secondary | ICD-10-CM | POA: Diagnosis present

## 2016-03-14 DIAGNOSIS — Z3A30 30 weeks gestation of pregnancy: Secondary | ICD-10-CM | POA: Diagnosis not present

## 2016-03-14 DIAGNOSIS — O3402 Maternal care for unspecified congenital malformation of uterus, second trimester: Secondary | ICD-10-CM

## 2016-03-14 DIAGNOSIS — O99013 Anemia complicating pregnancy, third trimester: Secondary | ICD-10-CM | POA: Diagnosis present

## 2016-03-14 DIAGNOSIS — N12 Tubulo-interstitial nephritis, not specified as acute or chronic: Secondary | ICD-10-CM | POA: Diagnosis present

## 2016-03-14 DIAGNOSIS — R109 Unspecified abdominal pain: Secondary | ICD-10-CM

## 2016-03-14 DIAGNOSIS — Z87891 Personal history of nicotine dependence: Secondary | ICD-10-CM

## 2016-03-14 DIAGNOSIS — Q513 Bicornate uterus: Secondary | ICD-10-CM | POA: Diagnosis not present

## 2016-03-14 DIAGNOSIS — O3403 Maternal care for unspecified congenital malformation of uterus, third trimester: Secondary | ICD-10-CM | POA: Diagnosis present

## 2016-03-14 DIAGNOSIS — D649 Anemia, unspecified: Secondary | ICD-10-CM | POA: Diagnosis present

## 2016-03-14 DIAGNOSIS — O26899 Other specified pregnancy related conditions, unspecified trimester: Secondary | ICD-10-CM

## 2016-03-14 DIAGNOSIS — O0990 Supervision of high risk pregnancy, unspecified, unspecified trimester: Secondary | ICD-10-CM

## 2016-03-14 LAB — URINALYSIS, ROUTINE W REFLEX MICROSCOPIC
BILIRUBIN URINE: NEGATIVE
GLUCOSE, UA: NEGATIVE mg/dL
HGB URINE DIPSTICK: NEGATIVE
Ketones, ur: NEGATIVE mg/dL
Nitrite: NEGATIVE
PH: 7 (ref 5.0–8.0)
Protein, ur: NEGATIVE mg/dL
SPECIFIC GRAVITY, URINE: 1.01 (ref 1.005–1.030)

## 2016-03-14 LAB — TYPE AND SCREEN
ABO/RH(D): O POS
Antibody Screen: NEGATIVE

## 2016-03-14 LAB — CBC
HEMATOCRIT: 32.2 % — AB (ref 36.0–46.0)
HEMOGLOBIN: 10.6 g/dL — AB (ref 12.0–15.0)
MCH: 23.7 pg — ABNORMAL LOW (ref 26.0–34.0)
MCHC: 32.9 g/dL (ref 30.0–36.0)
MCV: 72 fL — ABNORMAL LOW (ref 78.0–100.0)
Platelets: 177 10*3/uL (ref 150–400)
RBC: 4.47 MIL/uL (ref 3.87–5.11)
RDW: 15.3 % (ref 11.5–15.5)
WBC: 6.8 10*3/uL (ref 4.0–10.5)

## 2016-03-14 LAB — URINE MICROSCOPIC-ADD ON: RBC / HPF: NONE SEEN RBC/hpf (ref 0–5)

## 2016-03-14 MED ORDER — CALCIUM CARBONATE ANTACID 500 MG PO CHEW: 2.0000 | CHEWABLE_TABLET | ORAL | Status: DC | PRN

## 2016-03-14 MED ORDER — LACTATED RINGERS IV BOLUS (SEPSIS)
500.0000 mL | Freq: Once | INTRAVENOUS | Status: AC
  Administered 2016-03-14: 500 mL via INTRAVENOUS

## 2016-03-14 MED ORDER — DIPHENHYDRAMINE HCL 50 MG/ML IJ SOLN
25.0000 mg | Freq: Once | INTRAMUSCULAR | Status: AC
  Administered 2016-03-14: 25 mg via INTRAVENOUS
  Filled 2016-03-14: qty 1

## 2016-03-14 MED ORDER — NALBUPHINE HCL 10 MG/ML IJ SOLN
10.0000 mg | Freq: Once | INTRAMUSCULAR | Status: AC
  Administered 2016-03-14: 10 mg via INTRAVENOUS
  Filled 2016-03-14: qty 1

## 2016-03-14 MED ORDER — PROGESTERONE MICRONIZED 200 MG PO CAPS
200.0000 mg | ORAL_CAPSULE | Freq: Every day | ORAL | Status: DC
  Administered 2016-03-14 – 2016-03-16 (×3): 200 mg via VAGINAL
  Filled 2016-03-14 (×3): qty 1

## 2016-03-14 MED ORDER — DIPHENHYDRAMINE HCL 25 MG PO CAPS
25.0000 mg | ORAL_CAPSULE | Freq: Once | ORAL | Status: AC
  Administered 2016-03-14: 25 mg via ORAL
  Filled 2016-03-14: qty 1

## 2016-03-14 MED ORDER — ACETAMINOPHEN 500 MG PO TABS
1000.0000 mg | ORAL_TABLET | Freq: Four times a day (QID) | ORAL | Status: DC | PRN
  Administered 2016-03-16: 1000 mg via ORAL
  Filled 2016-03-14: qty 2

## 2016-03-14 MED ORDER — PRENATAL MULTIVITAMIN CH
1.0000 | ORAL_TABLET | Freq: Every day | ORAL | Status: DC
  Administered 2016-03-14 – 2016-03-16 (×3): 1 via ORAL
  Filled 2016-03-14 (×3): qty 1

## 2016-03-14 MED ORDER — MORPHINE SULFATE (PF) 4 MG/ML IV SOLN
2.0000 mg | Freq: Once | INTRAVENOUS | Status: AC
  Administered 2016-03-14: 2 mg via INTRAVENOUS
  Filled 2016-03-14: qty 1

## 2016-03-14 MED ORDER — CYCLOBENZAPRINE HCL 10 MG PO TABS
10.0000 mg | ORAL_TABLET | Freq: Three times a day (TID) | ORAL | Status: DC | PRN
  Filled 2016-03-14: qty 1

## 2016-03-14 MED ORDER — DEXTROSE 5 % IV SOLN
2.0000 g | INTRAVENOUS | Status: DC
  Administered 2016-03-14 – 2016-03-16 (×3): 2 g via INTRAVENOUS
  Filled 2016-03-14 (×3): qty 2

## 2016-03-14 MED ORDER — MORPHINE SULFATE (PF) 4 MG/ML IV SOLN: 3.0000 mg | INTRAVENOUS | Status: DC | PRN

## 2016-03-14 MED ORDER — PROMETHAZINE HCL 25 MG/ML IJ SOLN
12.5000 mg | Freq: Once | INTRAMUSCULAR | Status: AC
  Administered 2016-03-14: 12.5 mg via INTRAVENOUS
  Filled 2016-03-14: qty 1

## 2016-03-14 MED ORDER — HYDROMORPHONE HCL 2 MG/ML IJ SOLN
2.0000 mg | Freq: Once | INTRAMUSCULAR | Status: AC
  Administered 2016-03-14: 2 mg via INTRAVENOUS
  Filled 2016-03-14: qty 1

## 2016-03-14 MED ORDER — DOCUSATE SODIUM 100 MG PO CAPS
100.0000 mg | ORAL_CAPSULE | Freq: Every day | ORAL | Status: DC
Start: 2016-03-14 — End: 2016-03-17
  Administered 2016-03-14 – 2016-03-16 (×3): 100 mg via ORAL
  Filled 2016-03-14 (×3): qty 1

## 2016-03-14 MED ORDER — OXYCODONE-ACETAMINOPHEN 5-325 MG PO TABS
1.0000 | ORAL_TABLET | ORAL | Status: DC | PRN
  Administered 2016-03-15 – 2016-03-16 (×4): 2 via ORAL
  Administered 2016-03-16 (×2): 1 via ORAL
  Administered 2016-03-16: 2 via ORAL
  Administered 2016-03-17 (×2): 1 via ORAL
  Filled 2016-03-14: qty 1
  Filled 2016-03-14: qty 2
  Filled 2016-03-14: qty 1
  Filled 2016-03-14 (×2): qty 2
  Filled 2016-03-14: qty 1
  Filled 2016-03-14 (×3): qty 2

## 2016-03-14 NOTE — Progress Notes (Signed)
Pt returned from u/s. Pt c/o nausea and pt vomited while in u/s. Pt also c/o increasing pain. CNM notified.

## 2016-03-14 NOTE — MAU Note (Signed)
Back has been hurting for 3 days.  Mid right side and down.  Started while she was here the other day. No GI or GU complaints.

## 2016-03-14 NOTE — MAU Provider Note (Signed)
History     CSN: [id number]  Arrival date and time: 03/14/16 1536   First Provider Initiated Contact with Patient 03/14/16 1635      Chief Complaint  Patient presents with  . Back Pain   G1P0 @30 .3 weeks here with right lower back pain x3 days. She describes as constant and achy. She has used a heating pad with no relief. She denies contraction, VB, and LOF. She reports good FM. She denies urinary sx including hematuria.    OB History    Gravida Para Term Preterm AB Living   1         0   SAB TAB Ectopic Multiple Live Births                  Past Medical History:  Diagnosis Date  . 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 History of syncope    last episode 2014  . Palpitations   . SOB (shortness of breath)     Past Surgical History:  Procedure Laterality Date  . NO PAST SURGERIES      Family History  Problem Relation Age of Onset  . Asthma Brother   . Cancer Paternal Grandfather     Lung cancer    Social History  Substance Use Topics  . Smoking status: Former Games developermoker  . Smokeless tobacco: Never Used  . Alcohol use 0.0 oz/week     Comment: on occasion    Allergies: No Known Allergies  Prescriptions Prior to Admission  Medication Sig Dispense Refill Last Dose  . Prenatal Vit-Fe Fumarate-FA (PRENATAL MULTIVITAMIN) TABS tablet Take 1 tablet by mouth at bedtime.   03/13/2016 at Unknown time  . progesterone (PROMETRIUM) 200 MG capsule Place 200 mg vaginally at bedtime.   03/13/2016 at Unknown time  . NIFEdipine (PROCARDIA) 10 MG capsule Take 1 capsule (10 mg total) by mouth every 8 (eight) hours. (Patient not taking: Reported on 03/14/2016) 30 capsule 0 Not Taking at Unknown time    Review of Systems  Constitutional: Negative.  Negative for chills and fever.  Gastrointestinal: Negative for abdominal pain.  Genitourinary: Positive for flank pain. Negative for dysuria, frequency, hematuria and urgency.  Musculoskeletal: Positive for back pain.   Physical Exam   Blood  pressure 104/69, pulse 89, temperature 98.6 F (37 C), temperature source Oral, resp. rate 16, weight 66.9 kg (147 lb 6.4 oz), last menstrual period 07/24/2015.  Physical Exam  Constitutional: She is oriented to person, place, and time. She appears well-developed and well-nourished. No distress.  HENT:  Head: Normocephalic and atraumatic.  Neck: Normal range of motion. Neck supple.  Cardiovascular: Normal rate.   Respiratory: Effort normal.  GI: Soft. She exhibits no distension. There is no tenderness (right flank). There is no CVA tenderness.  gravid  Genitourinary:  Genitourinary Comments: SVE: closed/80  Musculoskeletal: Normal range of motion.       Arms: Neurological: She is alert and oriented to person, place, and time.  Skin: Skin is warm and dry.  Psychiatric: She has a normal mood and affect.   EFM: 135 bpm, mod variability, + accels, occ. variable decels Toco: irritability  Results for orders placed or performed during the hospital encounter of 03/14/16 (from the past 24 hour(s))  Urinalysis, Routine w reflex microscopic (not [facility])     Status: Abnormal   Collection Time: 03/14/16  3:58 PM  Result Value Ref Range   Color, Urine YELLOW YELLOW   APPearance CLEAR CLEAR   Specific Gravity, Urine 1.010  1.005 - 1.030   pH 7.0 5.0 - 8.0   Glucose, UA NEGATIVE NEGATIVE mg/dL   Hgb urine dipstick NEGATIVE NEGATIVE   Bilirubin Urine NEGATIVE NEGATIVE   Ketones, ur NEGATIVE NEGATIVE mg/dL   Protein, ur NEGATIVE NEGATIVE mg/dL   Nitrite NEGATIVE NEGATIVE   Leukocytes, UA SMALL (A) NEGATIVE  Urine microscopic-add on     Status: Abnormal   Collection Time: 03/14/16  3:58 PM  Result Value Ref Range   Squamous Epithelial / LPF 6-30 (A) NONE SEEN   WBC, UA 0-5 0 - 5 WBC/hpf   RBC / HPF NONE SEEN 0 - 5 RBC/hpf   Bacteria, UA MANY (A) NONE SEEN   Urine-Other MUCOUS PRESENT   CBC     Status: Abnormal   Collection Time: 03/14/16  4:20 PM  Result Value Ref Range   WBC 6.8 4.0 -  10.5 K/uL   RBC 4.47 3.87 - 5.11 MIL/uL   Hemoglobin 10.6 (L) 12.0 - 15.0 g/dL   HCT 16.132.2 (L) 09.636.0 - 04.546.0 %   MCV 72.0 (L) 78.0 - 100.0 fL   MCH 23.7 (L) 26.0 - 34.0 pg   MCHC 32.9 30.0 - 36.0 g/dL   RDW 40.915.3 81.111.5 - 91.415.5 %   Platelets 177 150 - 400 K/uL   [place] Renal  Result Date: 03/14/2016 CLINICAL DATA:  Antepartum flank pain. Right-sided pain. Thirty weeks pregnant. UA negative for red blood cell and positive for bacteria. EXAM: RENAL / URINARY TRACT ULTRASOUND COMPLETE COMPARISON:  None. FINDINGS: Right Kidney: Length: 11.4 cm. Mild to moderate right-sided pelvicaliectasis. Within the dilated collecting system are low-level echoes, including on image 16. Left Kidney: Length: 10.7 cm.  No hydronephrosis. Bladder: Primarily decompressed. Suboptimally evaluated secondary to patient's inability to lie still. Low-level dependent echoes are favored to be artifactual. IMPRESSION: 1. Mild to moderate right-sided pelvicaliectasis. This could be physiologic in the antepartum state. Low-level echoes in the dilated system are suspicious for ascending infection. 2. Normal left kidney and grossly normal appearance of the urinary bladder. Electronically Signed   By: Jeronimo GreavesKyle  Talbot M.D.   On: 03/14/2016 18:30   MAU Course  Procedures LR 1 L bolus Phenergan  Morphine Dilaudid Benadryl  MDM Pain worsened after US and pt began vomiting. Developed itching after Dilaudid. Labs and US ordered and reviewed. Suspected pyelonephritis. Discussed presentation, clinical findings, and plan with Dr. Vergie LivingPickens. Plan for admit.   Assessment and Plan  30.3 weeks Pyelonephritis Reactive NST  Admit to ante Rocephin NST daily Analgesia pen Management per MD  Donette LarryMelanie Juanita Devincent, CNM  03/14/2016 8:11 PM

## 2016-03-14 NOTE — Progress Notes (Signed)
CNM notified of pt refusal of fetal monitor. CNM states pt will be on NST when admitted so Ok to d/c attempts at fetal monitoring at this time.

## 2016-03-14 NOTE — H&P (Signed)
Melody Mcclure is a 25 y.o. female presenting for flank pain x3 days. Evaluated in MAU revealed suspected pyelonephritis.  OB History    Gravida Para Term Preterm AB Living   1         0   SAB TAB Ectopic Multiple Live Births                 Past Medical History:  Diagnosis Date  . 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 History of syncope    last episode 2014  . Palpitations   . SOB (shortness of breath)    Past Surgical History:  Procedure Laterality Date  . NO PAST SURGERIES     Family History: family history includes Asthma in her brother; Cancer in her paternal grandfather. Social History:  reports that she has quit smoking. She has never used smokeless tobacco. She reports that she drinks alcohol. She reports that she uses drugs, including Marijuana.     Maternal Diabetes: No Genetic Screening: Normal Maternal Ultrasounds/Referrals: Normal Fetal Ultrasounds or other Referrals:  None Maternal Substance Abuse:  No Significant Maternal Medications:  None Significant Maternal Lab Results:  None Other Comments:  None  Review of Systems  Constitutional: Negative.   Gastrointestinal: Positive for vomiting.  Genitourinary: Positive for flank pain.  Musculoskeletal: Positive for back pain.   Maternal Medical History:  Fetal activity: Perceived fetal activity is normal.   Last perceived fetal movement was within the past hour.    Prenatal complications: Bicornuate uterus Cervical shortening, dynamic cervix Preterm contractions    Dilation: Closed Effacement (%): 80 Exam by:: Donette LarryMelanie Neftali Abair, CNM Blood pressure 104/69, pulse 83, temperature 98.6 F (37 C), temperature source Oral, resp. rate 16, weight 66.9 kg (147 lb 6.4 oz), last menstrual period 07/24/2015, SpO2 99 %.   Fetal Exam Fetal Monitor Review: Mode: ultrasound.   Baseline rate: 135.  Variability: moderate (6-25 bpm).   Pattern: accelerations present and no decelerations.    Fetal State Assessment: Category I -  tracings are normal.     Physical Exam  Constitutional: She is oriented to person, place, and time. She appears well-developed and well-nourished.  HENT:  Head: Normocephalic and atraumatic.  Neck: Normal range of motion. Neck supple.  Cardiovascular: Normal rate.   Respiratory: Effort normal.  GI: Soft. She exhibits no distension. There is no tenderness.  gravid  Musculoskeletal: Normal range of motion.  Neurological: She is alert and oriented to person, place, and time.  Skin: Skin is warm and dry.  Psychiatric: She has a normal mood and affect.    Prenatal labs: ABO, Rh: O/POS/-- (07/05 1003) Antibody: NEG (07/05 1003) Rubella: 9.08 (07/05 1003) RPR: NON REAC (10/26 1551)  HBsAg: NEGATIVE (07/05 1003)  HIV: NONREACTIVE (10/26 1551)  GBS:     Assessment/Plan: 30.[redacted] weeks gestation Pyelonephritis  Admit to Ante Rocephin Daily NST Analgesia MD to manage   Donette LarryMelanie Siriah Treat, CNM 03/14/2016, 8:12 PM

## 2016-03-15 DIAGNOSIS — D649 Anemia, unspecified: Secondary | ICD-10-CM

## 2016-03-15 DIAGNOSIS — O99013 Anemia complicating pregnancy, third trimester: Secondary | ICD-10-CM

## 2016-03-15 LAB — COMPREHENSIVE METABOLIC PANEL
ALK PHOS: 63 U/L (ref 38–126)
ALT: 14 U/L (ref 14–54)
AST: 22 U/L (ref 15–41)
Albumin: 2.8 g/dL — ABNORMAL LOW (ref 3.5–5.0)
Anion gap: 8 (ref 5–15)
BUN: 8 mg/dL (ref 6–20)
CALCIUM: 8.5 mg/dL — AB (ref 8.9–10.3)
CO2: 23 mmol/L (ref 22–32)
CREATININE: 0.77 mg/dL (ref 0.44–1.00)
Chloride: 102 mmol/L (ref 101–111)
GFR calc non Af Amer: >60 mL/min (ref >60)
GLUCOSE: 69 mg/dL (ref 65–99)
Potassium: 3.4 mmol/L — ABNORMAL LOW (ref 3.5–5.1)
SODIUM: 133 mmol/L — AB (ref 135–145)
Total Bilirubin: 0.5 mg/dL (ref 0.3–1.2)
Total Protein: 5.9 g/dL — ABNORMAL LOW (ref 6.5–8.1)

## 2016-03-15 LAB — CBC WITH DIFFERENTIAL/PLATELET
Basophils Absolute: 0 10*3/uL (ref 0.0–0.1)
Basophils Relative: 0 %
EOS ABS: 0.1 10*3/uL (ref 0.0–0.7)
Eosinophils Relative: 1 %
HCT: 29.5 % — ABNORMAL LOW (ref 36.0–46.0)
HEMOGLOBIN: 9.8 g/dL — AB (ref 12.0–15.0)
LYMPHS ABS: 1.6 10*3/uL (ref 0.7–4.0)
LYMPHS PCT: 24 %
MCH: 24 pg — AB (ref 26.0–34.0)
MCHC: 33.2 g/dL (ref 30.0–36.0)
MCV: 72.1 fL — ABNORMAL LOW (ref 78.0–100.0)
Monocytes Absolute: 0.3 10*3/uL (ref 0.1–1.0)
Monocytes Relative: 4 %
NEUTROS PCT: 71 %
Neutro Abs: 4.7 10*3/uL (ref 1.7–7.7)
Platelets: 161 10*3/uL (ref 150–400)
RBC: 4.09 MIL/uL (ref 3.87–5.11)
RDW: 15.5 % (ref 11.5–15.5)
WBC: 6.6 10*3/uL (ref 4.0–10.5)

## 2016-03-15 MED ORDER — POTASSIUM CHLORIDE CRYS ER 20 MEQ PO TBCR
30.0000 meq | EXTENDED_RELEASE_TABLET | Freq: Two times a day (BID) | ORAL | Status: AC
  Administered 2016-03-15 – 2016-03-16 (×4): 30 meq via ORAL
  Filled 2016-03-15 (×4): qty 1

## 2016-03-15 MED ORDER — SODIUM CHLORIDE 0.9% FLUSH
3.0000 mL | Freq: Three times a day (TID) | INTRAVENOUS | Status: DC
  Administered 2016-03-15 – 2016-03-16 (×4): 3 mL via INTRAVENOUS

## 2016-03-15 MED ORDER — FERROUS GLUCONATE 324 (38 FE) MG PO TABS
324.0000 mg | ORAL_TABLET | Freq: Two times a day (BID) | ORAL | Status: DC
  Administered 2016-03-15 – 2016-03-16 (×4): 324 mg via ORAL
  Filled 2016-03-15 (×5): qty 1

## 2016-03-15 NOTE — Plan of Care (Signed)
Problem: Education: Goal: Knowledge of [facility] Education information/materials will improve Outcome: Progressing  Initiated general education and room orientation.  Problem: Safety: Goal: Ability to remain free from injury will improve Outcome: Progressing Encouraged to call assist when needed.  Problem: Pain Managment: Goal: General experience of comfort will improve Outcome: Progressing At this time with good pain control with analgesia.  Problem: Physical Regulation: Goal: Ability to maintain clinical measurements within normal limits will improve Outcome: Progressing Initial lab. Works for repeat. Goal: Will remain free from infection Outcome: Progressing Antibiotic treatment for pyelo started.  Problem: Skin Integrity: Goal: Risk for impaired skin integrity will decrease Outcome: Completed/Met Date Met: 03/15/16 Skin intact, dry and warm.  Problem: Activity: Goal: Risk for activity intolerance will decrease Out of bed to bathroom with minimal assist tolerated well.  Problem: Nutrition: Goal: Adequate nutrition will be maintained Outcome: Progressing Tolerated food with fair appetite.  Problem: Coping: Goal: Level of anxiety will decrease Outcome: Progressing After explanation of treatment plan, patient"s level of anxiety decreased.

## 2016-03-15 NOTE — Progress Notes (Signed)
Daily Antepartum Note  Admission Date: 03/14/2016 Current Date: 03/15/2016 7:41 AM  Melody Mcclure is a 25 y.o. G1P0 @ 6934w4d, HD#2, admitted for right sided pyelo.  Pregnancy complicated by: Patient Active Problem List   Diagnosis Date Noted  . Pyelonephritis affecting pregnancy in third trimester 03/14/2016  . Short cervix, antepartum 01/29/2016  . Supervision of high risk pregnancy, antepartum 10/23/2015  . Bicornate uterus complicating pregnancy 10/23/2015    Overnight/24hr events:  None  Subjective:  Pt feeling much better this AM and not having any more right sided back pain. No fevers, chills, decreased FM or s/s of PTL  Objective:    Current Vital Signs 24h Vital Sign Ranges  T 98.5 F (36.9 C) Temp  Avg: 98.4 F (36.9 C)  Min: 98.1 F (36.7 C)  Max: 98.6 F (37 C)  BP 106/69 BP  Min: 104/69  Max: 122/76  HR 89 Pulse  Avg: 102.5  Min: 83  Max: 121  RR 16 Resp  Avg: 16  Min: 16  Max: 16  SaO2 100 % Not Delivered SpO2  Avg: 98.9 %  Min: 97 %  Max: 100 %       24 Hour I/O Current Shift I/O  Time Ins Outs No intake/output data recorded. No intake/output data recorded.   Patient Vitals for the past 24 hrs:  BP Temp Temp src Pulse Resp SpO2 Height Weight  03/15/16 0554 106/69 98.5 F (36.9 C) Oral 89 16 100 % - -  03/14/16 2147 122/76 98.1 F (36.7 C) Oral 87 16 100 % 5\' 7"  (1.702 m) 66.9 kg (147 lb 6.4 oz)  03/14/16 2039 - - - 95 - 99 % - -  03/14/16 2034 - - - 104 - 97 % - -  03/14/16 2029 - - - 112 - 97 % - -  03/14/16 2024 - - - 117 - 97 % - -  03/14/16 2014 - - - 113 - 97 % - -  03/14/16 2009 - - - 116 - 98 % - -  03/14/16 1905 - - - 83 - 99 % - -  03/14/16 1900 - - - 88 - 100 % - -  03/14/16 1858 - - - 101 - 100 % - -  03/14/16 1848 - - - 104 - 100 % - -  03/14/16 1845 - - - 111 - 100 % - -  03/14/16 1840 - - - 95 - 100 % - -  03/14/16 1830 - - - 118 - 100 % - -  03/14/16 1824 - - - (!) 121 - 98 % - -  03/14/16 1550 104/69 98.6 F (37 C) Oral 89 16 - -  66.9 kg (147 lb 6.4 oz)    Physical exam: General: Well nourished, well developed female in no acute distress. Abdomen: gravid, nttp Back: no cvat Cardiovascular: S1, S2 normal, no murmur, rub or gallop, regular rate and rhythm Respiratory: CTAB Extremities: no clubbing, cyanosis or edema Skin: Warm and dry.   Medications: Current Facility-Administered Medications  Medication Dose Route Frequency Provider Last Rate Last Dose  . acetaminophen (TYLENOL) tablet 1,000 mg  1,000 mg Oral Q6H PRN Donette LarryMelanie Bhambri, CNM      . calcium carbonate (TUMS - dosed in mg elemental calcium) chewable tablet 400 mg of elemental calcium  2 tablet Oral Q4H PRN Donette LarryMelanie Bhambri, CNM      . cefTRIAXone (ROCEPHIN) 2 g in dextrose 5 % 50 mL IVPB  2 g Intravenous Q24H Shawna OrleansMelanie  Bhambri, CNM   2 g at 03/14/16 2327  . docusate sodium (COLACE) capsule 100 mg  100 mg Oral Daily Donette LarryMelanie Bhambri, CNM   100 mg at 03/14/16 2327  . morphine 4 MG/ML injection 3 mg  3 mg Intravenous Q2H PRN Donette LarryMelanie Bhambri, CNM      . oxyCODONE-acetaminophen (PERCOCET/ROXICET) 5-325 MG per tablet 1-2 tablet  1-2 tablet Oral Q4H PRN Donette LarryMelanie Bhambri, CNM      . prenatal multivitamin tablet 1 tablet  1 tablet Oral QHS Donette LarryMelanie Bhambri, CNM   1 tablet at 03/14/16 2334  . progesterone (PROMETRIUM) capsule 200 mg  200 mg Vaginal QHS Donette LarryMelanie Bhambri, CNM   200 mg at 03/14/16 2327    Labs:   Recent Labs Lab 03/09/16 1106 03/14/16 1620 03/15/16 0621  WBC 8.5 6.8 6.6  HGB 10.6* 10.6* 9.8*  HCT 31.5* 32.2* 29.5*  PLT 174 177 161     Recent Labs Lab 03/15/16 0621  NA 133*  K 3.4*  CL 102  CO2 23  BUN 8  CREATININE 0.77  CALCIUM 8.5*  PROT 5.9*  BILITOT 0.5  ALKPHOS 63  ALT 14  AST 22  GLUCOSE 69   Pending: UCx  Radiology: no new imaging.   Assessment & Plan:  Pt doing well *Pregnancy: fetal status reassuring. qday NSTs *Pyelo: follow up UCx. Consider transition to PO abx if still afebrile *Anemia: add PO iron  *Preterm:  no concern for PTL -normal 11/3 growth us/ac/afi. GA too far along for CL measurement.  *h/o shortened cx: continue qhs prometrium. Last CL one month ago had CL >7225mm.  -cl/70% on admission which is essentially unchanged from prior ones; pt checked b/c was feeling some UCs. Negative FFN on 10/30 *PPx: SCDs, OOB ad lib *FEN/GI: regular diet *Dispo: possibly tomorrow.   Cornelia Copaharlie Venus Ruhe, Jr. MD Attending Center for [facility](Faculty Practice)

## 2016-03-15 NOTE — Progress Notes (Signed)
Dr. Alysia PennaErvin notified that pt has a reactive NST with 1 uc that she describes as tightening. No vaginal bleeding or leaking of fluid.

## 2016-03-16 LAB — CULTURE, OB URINE

## 2016-03-16 NOTE — Progress Notes (Signed)
ACULTY PRACTICE ANTEPARTUM COMPREHENSIVE PROGRESS NOTE  Melody Mcclure is a [age] y.o. G1P0 at 5942w5d  who is admitted for pyelonephritis on 03/14/16.   Fetal presentation is unsure. Length of Stay:  2  Days  Subjective: Pt reports still having right side/back pain. She is not taking any pain medication per nurses. Tolerating diet. She reports + Fm. Denies ut ctx.   Vitals:  Blood pressure (!) 94/49, pulse 74, temperature 98.6 F (37 C), temperature source Oral, resp. rate 18, height 5\' 7"  (1.702 m), weight 147 lb 6.4 oz (66.9 kg), last menstrual period 07/24/2015, SpO2 99 %. Physical Examination: Lungs clear Heart RRR Abd soft + BS gravid non tender Back R CVA tenderness  Fetal Monitoring:  R NST  Labs:  No results found for this or any previous visit (from the past 24 hour(s)).        Medications:  Scheduled . cefTRIAXone (ROCEPHIN)  IV  2 g Intravenous Q24H  . docusate sodium  100 mg Oral Daily  . ferrous gluconate  324 mg Oral BID WC  . potassium chloride  30 mEq Oral BID  . prenatal multivitamin  1 tablet Oral QHS  . progesterone  200 mg Vaginal QHS  . sodium chloride flush  3 mL Intravenous Q8H   I have reviewed the patient's current medications.  ASSESSMENT: Patient Active Problem List   Diagnosis Date Noted  . Anemia affecting pregnancy 03/15/2016  . Pyelonephritis affecting pregnancy in third trimester 03/14/2016  . Short cervix, antepartum 01/29/2016  . Supervision of high risk pregnancy, antepartum 10/23/2015  . Bicornate uterus complicating pregnancy 10/23/2015    PLAN: IUP 30 5/7 wks Pyelonephritis Bicornate uterus  Pt is afebrile. But still has RCVA tenderness. UC pending. Pt reassured in regards to pain medication. Prometrium for  bicornate uterus.  Would like to see improvement of CVA tenderness before d/c home. Hopefully UC will return today and direct antibiotic per results   Hermina StaggersMichael L Ervin 03/16/2016,6:37 AM

## 2016-03-17 MED ORDER — CEFADROXIL 500 MG PO CAPS: 500.0000 mg | ORAL_CAPSULE | Freq: Two times a day (BID) | ORAL | 0 refills | Status: DC

## 2016-03-17 MED ORDER — IBUPROFEN 600 MG PO TABS
600.0000 mg | ORAL_TABLET | Freq: Once | ORAL | Status: AC
  Administered 2016-03-17: 600 mg via ORAL
  Filled 2016-03-17: qty 1

## 2016-03-17 MED ORDER — OXYCODONE-ACETAMINOPHEN 5-325 MG PO TABS: 1.0000 | ORAL_TABLET | Freq: Four times a day (QID) | ORAL | 0 refills | Status: DC | PRN

## 2016-03-17 MED ORDER — NITROFURANTOIN MONOHYD MACRO 100 MG PO CAPS: 100.0000 mg | ORAL_CAPSULE | Freq: Every day | ORAL | 2 refills | Status: DC

## 2016-03-17 NOTE — Discharge Instructions (Signed)

## 2016-03-17 NOTE — Discharge Summary (Signed)
OB Discharge Summary     Patient Name: Melody Mcclure DOB: 04/19/1991 MRN: [id number]  Date of admission: 03/14/2016  Date of discharge: 03/17/2016  Admitting diagnosis: back pain for 3 days 30 wks Intrauterine pregnancy: 666w6d     Secondary diagnosis:  Active Problems:   Pyelonephritis affecting pregnancy in third trimester   Anemia affecting pregnancy  Additional problems: none     Discharge diagnosis: same                                                                                                Hospital course:  Patient admitted on 11/4 with pyelonephritis on ceftriaxone 2g q24hr. Sh has remained afebrile since admission. Her white count is normal. Her urine culture was negative. Her CVA tenderness has improved and she is ready for discharge to home. She will start cefadroxil 500mg  BID and will start nitrofurantoin for prophylaxis at the completion of her antibiotics. [facility]  Physical exam  Vitals:   03/16/16 0824 03/16/16 1010 03/16/16 1428 03/16/16 1950  BP: 103/72  (!) 85/52   Pulse: 83  71   Resp: 16  16 16   Temp: 98.5 F (36.9 C) 98.9 F (37.2 C)  97.9 F (36.6 C)  TempSrc: Oral Oral  Oral  SpO2:      Weight:      Height:       General: alert, cooperative and no distress Heart: regular rate, no murmur Lungs: clear to auscultation bilaterally, no wheezing. Abd: Soft, nontender, gravid. +CVA tenderness on right. DVT Evaluation: No evidence of DVT seen on physical exam. No cords or calf tenderness.  Labs: Lab Results  Component Value Date   WBC 6.6 03/15/2016   HGB 9.8 (L) 03/15/2016   HCT 29.5 (L) 03/15/2016   MCV 72.1 (L) 03/15/2016   PLT 161 03/15/2016   CMP Latest Ref Rng & Units 03/15/2016  Glucose 65 - 99 mg/dL 69  BUN 6 - 20 mg/dL 8  Creatinine 4.090.44 - 8.111.00 mg/dL 9.140.77  Sodium 782135 - 956145 mmol/L 133(L)  Potassium 3.5 - 5.1 mmol/L 3.4(L)  Chloride 101 - 111 mmol/L 102  CO2 22 - 32 mmol/L 23  Calcium 8.9 - 10.3  mg/dL 2.1(H8.5(L)  Total Protein 6.5 - 8.1 g/dL 5.9(L)  Total Bilirubin 0.3 - 1.2 mg/dL 0.5  Alkaline Phos 38 - 126 U/L 63  AST 15 - 41 U/L 22  ALT 14 - 54 U/L 14    Discharge instruction: per After Visit Summary and "Baby and Me Booklet".  After visit meds:    Medication List    TAKE these medications   cefadroxil 500 MG capsule Commonly known as:  DURICEF Take 1 capsule (500 mg total) by mouth 2 (two) times daily.   NIFEdipine 10 MG capsule Commonly known as:  PROCARDIA Take 1 capsule (10 mg total) by mouth every 8 (eight) hours.   nitrofurantoin (macrocrystal-monohydrate) 100 MG capsule Commonly known as:  MACROBID Take 1 capsule (100 mg total) by mouth at bedtime. Start after completion of cefadroxil   oxyCODONE-acetaminophen 5-325 MG tablet  Commonly known as:  PERCOCET/ROXICET Take 1 tablet by mouth every 6 (six) hours as needed for severe pain.   prenatal multivitamin Tabs tablet Take 1 tablet by mouth at bedtime.   progesterone 200 MG capsule Commonly known as:  PROMETRIUM Place 200 mg vaginally at bedtime.       Diet: routine diet  Activity: Advance as tolerated. Pelvic rest for 6 weeks.   Outpatient follow up: thursday Follow up Appt:Future Appointments Date Time Provider Department Center  03/19/2016 1:40 PM Hermina StaggersMichael L Ervin, MD [facility]  04/10/2016 8:30 AM [facility] US 5 [facility]  08/28/2016 9:30 AM Harrington ChallengerNancy J Young, NP [facility]   Follow up Visit:No Follow-up on file.   03/17/2016 Melody Mcclure JEHIEL, DO

## 2016-03-17 NOTE — Progress Notes (Signed)
Discharge instructions given, questions answered, pt states understanding, signed and given copy 

## 2016-03-18 ENCOUNTER — Inpatient Hospital Stay (HOSPITAL_COMMUNITY)
Admission: AD | Admit: 2016-03-18 | Discharge: 2016-03-18 | Disposition: A | Discharge status: Home / Self Care | Payer: Medicaid Other | Source: Ambulatory Visit | Attending: Obstetrics and Gynecology | Admitting: Obstetrics and Gynecology

## 2016-03-18 ENCOUNTER — Inpatient Hospital Stay (HOSPITAL_COMMUNITY): Payer: Medicaid Other

## 2016-03-18 ENCOUNTER — Encounter (HOSPITAL_COMMUNITY): Payer: Self-pay | Admitting: *Deleted

## 2016-03-18 DIAGNOSIS — R42 Dizziness and giddiness: Secondary | ICD-10-CM | POA: Diagnosis present

## 2016-03-18 DIAGNOSIS — O3402 Maternal care for unspecified congenital malformation of uterus, second trimester: Secondary | ICD-10-CM

## 2016-03-18 DIAGNOSIS — O2303 Infections of kidney in pregnancy, third trimester: Secondary | ICD-10-CM | POA: Diagnosis not present

## 2016-03-18 DIAGNOSIS — R109 Unspecified abdominal pain: Secondary | ICD-10-CM

## 2016-03-18 DIAGNOSIS — Z87891 Personal history of nicotine dependence: Secondary | ICD-10-CM | POA: Insufficient documentation

## 2016-03-18 DIAGNOSIS — O26893 Other specified pregnancy related conditions, third trimester: Secondary | ICD-10-CM | POA: Diagnosis not present

## 2016-03-18 DIAGNOSIS — O0990 Supervision of high risk pregnancy, unspecified, unspecified trimester: Secondary | ICD-10-CM

## 2016-03-18 DIAGNOSIS — M549 Dorsalgia, unspecified: Secondary | ICD-10-CM | POA: Diagnosis not present

## 2016-03-18 DIAGNOSIS — Z3A31 31 weeks gestation of pregnancy: Secondary | ICD-10-CM | POA: Insufficient documentation

## 2016-03-18 DIAGNOSIS — Q513 Bicornate uterus: Secondary | ICD-10-CM

## 2016-03-18 HISTORY — DX: Tubulo-interstitial nephritis, not specified as acute or chronic: N12

## 2016-03-18 LAB — CBC WITH DIFFERENTIAL/PLATELET
BASOS ABS: 0 10*3/uL (ref 0.0–0.1)
BASOS PCT: 0 %
EOS ABS: 0.1 10*3/uL (ref 0.0–0.7)
EOS PCT: 1 %
HCT: 34.7 % — ABNORMAL LOW (ref 36.0–46.0)
Hemoglobin: 11.6 g/dL — ABNORMAL LOW (ref 12.0–15.0)
Lymphocytes Relative: 14 %
Lymphs Abs: 1.3 10*3/uL (ref 0.7–4.0)
MCH: 24.1 pg — ABNORMAL LOW (ref 26.0–34.0)
MCHC: 33.4 g/dL (ref 30.0–36.0)
MCV: 72 fL — ABNORMAL LOW (ref 78.0–100.0)
MONO ABS: 0.3 10*3/uL (ref 0.1–1.0)
Monocytes Relative: 4 %
Neutro Abs: 7.4 10*3/uL (ref 1.7–7.7)
Neutrophils Relative %: 81 %
PLATELETS: 181 10*3/uL (ref 150–400)
RBC: 4.82 MIL/uL (ref 3.87–5.11)
RDW: 15.4 % (ref 11.5–15.5)
WBC: 9.1 10*3/uL (ref 4.0–10.5)

## 2016-03-18 LAB — BASIC METABOLIC PANEL
ANION GAP: 8 (ref 5–15)
BUN: 8 mg/dL (ref 6–20)
CALCIUM: 9.2 mg/dL (ref 8.9–10.3)
CO2: 23 mmol/L (ref 22–32)
Chloride: 103 mmol/L (ref 101–111)
Creatinine, Ser: 0.7 mg/dL (ref 0.44–1.00)
GFR calc Af Amer: >60 mL/min (ref >60)
GLUCOSE: 83 mg/dL (ref 65–99)
Potassium: 3.9 mmol/L (ref 3.5–5.1)
Sodium: 134 mmol/L — ABNORMAL LOW (ref 135–145)

## 2016-03-18 LAB — URINALYSIS, ROUTINE W REFLEX MICROSCOPIC
Bilirubin Urine: NEGATIVE
GLUCOSE, UA: NEGATIVE mg/dL
Hgb urine dipstick: NEGATIVE
Ketones, ur: NEGATIVE mg/dL
Nitrite: NEGATIVE
PROTEIN: NEGATIVE mg/dL
Specific Gravity, Urine: 1.015 (ref 1.005–1.030)
pH: 7.5 (ref 5.0–8.0)

## 2016-03-18 LAB — URINE MICROSCOPIC-ADD ON: RBC / HPF: NONE SEEN RBC/hpf (ref 0–5)

## 2016-03-18 LAB — AMYLASE: Amylase: 85 U/L (ref 28–100)

## 2016-03-18 LAB — LIPASE, BLOOD: LIPASE: 20 U/L (ref 11–51)

## 2016-03-18 MED ORDER — CYCLOBENZAPRINE HCL 10 MG PO TABS
10.0000 mg | ORAL_TABLET | Freq: Once | ORAL | Status: AC
  Administered 2016-03-18: 10 mg via ORAL
  Filled 2016-03-18: qty 1

## 2016-03-18 MED ORDER — CYCLOBENZAPRINE HCL 10 MG PO TABS: 10.0000 mg | ORAL_TABLET | Freq: Three times a day (TID) | ORAL | 1 refills | Status: DC | PRN

## 2016-03-18 MED ORDER — PROMETHAZINE HCL 25 MG/ML IJ SOLN
12.5000 mg | Freq: Four times a day (QID) | INTRAMUSCULAR | Status: DC | PRN
  Administered 2016-03-18: 12.5 mg via INTRAVENOUS
  Filled 2016-03-18: qty 1

## 2016-03-18 MED ORDER — BUTORPHANOL TARTRATE 1 MG/ML IJ SOLN: 2.0000 mg | Freq: Once | INTRAMUSCULAR | Status: DC

## 2016-03-18 MED ORDER — SODIUM CHLORIDE 0.9 % IV SOLN
Freq: Once | INTRAVENOUS | Status: AC
  Administered 2016-03-18: 15:00:00 via INTRAVENOUS
  Filled 2016-03-18: qty 1000

## 2016-03-18 MED ORDER — FENTANYL CITRATE (PF) 100 MCG/2ML IJ SOLN
50.0000 ug | Freq: Once | INTRAMUSCULAR | Status: AC
  Administered 2016-03-18: 50 ug via INTRAVENOUS
  Filled 2016-03-18: qty 2

## 2016-03-18 NOTE — Discharge Instructions (Signed)

## 2016-03-18 NOTE — MAU Note (Signed)
Waiting for D/C papers.

## 2016-03-18 NOTE — MAU Note (Signed)
Pt C/O feeling lightheaded & dizzy, vomited once today.  Was DC'd yesterday - had been admitted for pyelonephritis.  Is still taking antibiotics.  C/O R back pain, no abd pain, bleeding or LOF.

## 2016-03-18 NOTE — MAU Note (Signed)
Up to BR. States she feels better. Preparing for discharge.

## 2016-03-18 NOTE — MAU Provider Note (Signed)
History   Melody Mcclure is a 25 year old G1P0 here who felt dizzy this morning, couldn't eat her breakfast and then threw up. She also has right sided CVA tenderness which is now a 3/10 but when it hurts its a 8/10. [facility] of pylenephiritis. She is currently taking her antibiotics and has an appointment in the clinic tomorrow with Dr. Alysia PennaErvin. She has not taken her nifedipine since yesterday because she does not like the way it makes her feel.  She does not feel her contractions but does complain of consistent CVA tenderness.   CSN: [id number]  Arrival date and time: 03/18/16 1311   None     Chief Complaint  Patient presents with  . Dizziness   Dizziness  This is a new problem. The current episode started today. Associated symptoms include abdominal pain, arthralgias, a change in bowel habit, fatigue, nausea and vomiting. Pertinent negatives include no anorexia, chest pain, chills, coughing, diaphoresis, fever, headaches, joint swelling, myalgias, neck pain, numbness, rash, sore throat, swollen glands or urinary symptoms. She has tried eating for the symptoms. The treatment provided no relief.    OB History    Gravida Para Term Preterm AB Living   1         0   SAB TAB Ectopic Multiple Live Births                  Past Medical History:  Diagnosis Date  . 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 History of syncope    last episode 2014  . Palpitations   . SOB (shortness of breath)     Past Surgical History:  Procedure Laterality Date  . NO PAST SURGERIES      Family History  Problem Relation Age of Onset  . Asthma Brother   . Cancer Paternal Grandfather     Lung cancer    Social History  Substance Use Topics  . Smoking status: Former Games developermoker  . Smokeless tobacco: Never Used  . Alcohol use 0.0 oz/week     Comment: on occasion    Allergies:  Allergies  Allergen Reactions  . Dilaudid [Hydromorphone Hcl] Itching     Prescriptions Prior to Admission  Medication Sig Dispense Refill Last Dose  . cefadroxil (DURICEF) 500 MG capsule Take 1 capsule (500 mg total) by mouth 2 (two) times daily. 20 capsule 0 03/18/2016 at Unknown time  . oxyCODONE-acetaminophen (PERCOCET/ROXICET) 5-325 MG tablet Take 1 tablet by mouth every 6 (six) hours as needed for severe pain. 12 tablet 0 03/18/2016 at Unknown time  . Prenatal Vit-Fe Fumarate-FA (PRENATAL MULTIVITAMIN) TABS tablet Take 1 tablet by mouth at bedtime.   03/17/2016 at Unknown time  . progesterone (PROMETRIUM) 200 MG capsule Place 200 mg vaginally at bedtime.   03/17/2016 at Unknown time  . NIFEdipine (PROCARDIA) 10 MG capsule Take 1 capsule (10 mg total) by mouth every 8 (eight) hours. (Patient not taking: Reported on 03/14/2016) 30 capsule 0 Not Taking at Unknown time  . nitrofurantoin, macrocrystal-monohydrate, (MACROBID) 100 MG capsule Take 1 capsule (100 mg total) by mouth at bedtime. Start after completion of cefadroxil (Patient not taking: Reported on 03/18/2016) 30 capsule 2 Not Taking at Unknown time    Review of Systems  Constitutional: Positive for fatigue. Negative for chills, diaphoresis and fever.  HENT: Negative for sore throat.   Respiratory: Negative for cough.   Cardiovascular: Negative for chest pain.  Gastrointestinal: Positive for abdominal pain, change  in bowel habit, nausea and vomiting. Negative for anorexia.  Musculoskeletal: Positive for arthralgias. Negative for joint swelling, myalgias and neck pain.  Skin: Negative for rash.  Neurological: Positive for dizziness. Negative for numbness and headaches.   Physical Exam   Blood pressure 109/73, pulse 90, temperature 98.1 F (36.7 C), temperature source Oral, resp. rate 18, last menstrual period 07/24/2015.  Physical Exam  Constitutional: She is oriented to person, place, and time. She appears well-developed.  HENT:  Head: Normocephalic.  Neck: Normal range of motion.  Respiratory:  Effort normal.  GI: Soft. She exhibits no distension and no mass. There is no tenderness. There is no rebound and no guarding.  Musculoskeletal: Normal range of motion.  Neurological: She is alert and oriented to person, place, and time.  Skin: Skin is warm and dry.  Cervix is closed and 60% effaced.   MAU Course  Procedures  MDM -IV hydration -CMP  -CBC -NST -Flexerall for pain -Renal US  Patient Melody Mcclure is a 25 year old G1P0 at 5631 weeks who is here complaining of dizziness and back pain. [facility] treatment for pyelonephritis. She has been taking her Duricef but not her procardia because she doesn't like the way it feels. She is taking her prometrium. She was contracting 3 contractions in 10 minutes but not feeling any contraction pain. Her cervix is closed and 60% effaced.  FHR is 135 with moderate variability and accelerations and occasional variables. Patient received IV fluids for dehydration and contractions, and Flexerall and fentanyl for pain.  Labs show no elevated white blood cells or elevated pancreatic enzymes.   Assessment and Plan  Patient feels better now after IV fluids and pain medicine. After a 2nd renal US today showed no changes in her right kidney, patient to be discharged with referral for outpatient renal MRI and a prescription for Flexerall. She is to discontinue her procardia.  Charlesetta GaribaldiKathryn Lorraine Kooistra CNM 03/18/2016, 1:55 PM

## 2016-03-19 ENCOUNTER — Ambulatory Visit (INDEPENDENT_AMBULATORY_CARE_PROVIDER_SITE_OTHER): Payer: Medicaid Other | Admitting: Obstetrics and Gynecology

## 2016-03-19 VITALS — BP 120/74 | HR 86 | Wt 153.0 lb

## 2016-03-19 DIAGNOSIS — D649 Anemia, unspecified: Secondary | ICD-10-CM

## 2016-03-19 DIAGNOSIS — O3402 Maternal care for unspecified congenital malformation of uterus, second trimester: Secondary | ICD-10-CM

## 2016-03-19 DIAGNOSIS — Q513 Bicornate uterus: Secondary | ICD-10-CM

## 2016-03-19 DIAGNOSIS — O34593 Maternal care for other abnormalities of gravid uterus, third trimester: Secondary | ICD-10-CM

## 2016-03-19 DIAGNOSIS — O26879 Cervical shortening, unspecified trimester: Secondary | ICD-10-CM

## 2016-03-19 DIAGNOSIS — O0990 Supervision of high risk pregnancy, unspecified, unspecified trimester: Secondary | ICD-10-CM

## 2016-03-19 DIAGNOSIS — O99013 Anemia complicating pregnancy, third trimester: Secondary | ICD-10-CM

## 2016-03-19 DIAGNOSIS — O26873 Cervical shortening, third trimester: Secondary | ICD-10-CM

## 2016-03-19 NOTE — Progress Notes (Signed)
Subjective:  Melody Mcclure is a 25 y.o. G1P0 at 1327w1d being seen today for ongoing prenatal care.  She is currently monitored for the following issues for this high-risk pregnancy and has Supervision of high risk pregnancy, antepartum; Bicornate uterus complicating pregnancy; Short cervix, antepartum; and Anemia affecting pregnancy on her problem list.  Patient has started on Procardia last week after visit in MAU for ut ctx. But she is unable to tolerate. Was also hospitalized over the weekend for possible pyelonephritis but had negative renal U/S and Uc. Completing antibiotic course now. Was seen in MAU yesterday for continued right CVA tenderness. W/U including repeat renal U/S was normal. Placed on Flexeril but she has not picked up yet. MRI set up to r/o kidney stone. Pt reports feeling better today. Procardia was d/c d/t intolerance and no cervical change x 1 week.   Contractions: Not present. Vag. Bleeding: None.  Movement: Present. Denies leaking of fluid.   The following portions of the patient's history were reviewed and updated as appropriate: allergies, current medications, past family history, past medical history, past social history, past surgical history and problem list. Problem list updated.  Objective:   Vitals:   03/19/16 1403  BP: 120/74  Pulse: 86  Weight: 153 lb (69.4 kg)    Fetal Status: Fetal Heart Rate (bpm): 132   Movement: Present     General:  Alert, oriented and cooperative. Patient is in no acute distress.  Skin: Skin is warm and dry. No rash noted.   Cardiovascular: Normal heart rate noted  Respiratory: Normal respiratory effort, no problems with respiration noted  Abdomen: Soft, gravid, appropriate for gestational age. Pain/Pressure: Absent     Pelvic:  Cervical exam deferred        Extremities: Normal range of motion.  Edema: None  Mental Status: Normal mood and affect. Normal behavior. Normal judgment and thought content.   Urinalysis:      Assessment  and Plan:  Pregnancy: G1P0 at 4727w1d  1. Uterus bicornis affecting pregnancy in second trimester Stable Continue with Prometrium F/U U/S ordered  2. Supervision of high risk pregnancy, antepartum Stable See above  3. Short cervix, antepartum See Above  4. Anemia affecting pregnancy in third trimester Continue with PNV  Preterm labor symptoms and general obstetric precautions including but not limited to vaginal bleeding, contractions, leaking of fluid and fetal movement were reviewed in detail with the patient. Please refer to After Visit Summary for other counseling recommendations.  Return in about 2 weeks (around 04/02/2016).   Hermina StaggersMichael L Kaila Devries, MD

## 2016-03-20 ENCOUNTER — Encounter: Payer: Self-pay | Admitting: Obstetrics and Gynecology

## 2016-04-01 ENCOUNTER — Inpatient Hospital Stay (HOSPITAL_COMMUNITY)
Admission: AD | Admit: 2016-04-01 | Discharge: 2016-04-10 | DRG: 765 | Disposition: A | Discharge status: Home / Self Care | Payer: Medicaid Other | Source: Ambulatory Visit | Attending: Obstetrics & Gynecology | Admitting: Obstetrics & Gynecology

## 2016-04-01 ENCOUNTER — Encounter (HOSPITAL_COMMUNITY): Payer: Self-pay

## 2016-04-01 DIAGNOSIS — O26873 Cervical shortening, third trimester: Secondary | ICD-10-CM | POA: Diagnosis present

## 2016-04-01 DIAGNOSIS — O3403 Maternal care for unspecified congenital malformation of uterus, third trimester: Secondary | ICD-10-CM | POA: Diagnosis present

## 2016-04-01 DIAGNOSIS — Z3A34 34 weeks gestation of pregnancy: Secondary | ICD-10-CM | POA: Diagnosis not present

## 2016-04-01 DIAGNOSIS — Q513 Bicornate uterus: Secondary | ICD-10-CM | POA: Diagnosis not present

## 2016-04-01 DIAGNOSIS — O42919 Preterm premature rupture of membranes, unspecified as to length of time between rupture and onset of labor, unspecified trimester: Secondary | ICD-10-CM | POA: Diagnosis present

## 2016-04-01 DIAGNOSIS — O99013 Anemia complicating pregnancy, third trimester: Secondary | ICD-10-CM | POA: Diagnosis present

## 2016-04-01 DIAGNOSIS — O321XX0 Maternal care for breech presentation, not applicable or unspecified: Secondary | ICD-10-CM | POA: Diagnosis present

## 2016-04-01 DIAGNOSIS — O42913 Preterm premature rupture of membranes, unspecified as to length of time between rupture and onset of labor, third trimester: Secondary | ICD-10-CM | POA: Diagnosis present

## 2016-04-01 DIAGNOSIS — Z87891 Personal history of nicotine dependence: Secondary | ICD-10-CM | POA: Diagnosis not present

## 2016-04-01 DIAGNOSIS — O3402 Maternal care for unspecified congenital malformation of uterus, second trimester: Secondary | ICD-10-CM

## 2016-04-01 DIAGNOSIS — Z98891 History of uterine scar from previous surgery: Secondary | ICD-10-CM

## 2016-04-01 DIAGNOSIS — O9902 Anemia complicating childbirth: Secondary | ICD-10-CM | POA: Diagnosis present

## 2016-04-01 DIAGNOSIS — O42013 Preterm premature rupture of membranes, onset of labor within 24 hours of rupture, third trimester: Secondary | ICD-10-CM | POA: Diagnosis not present

## 2016-04-01 DIAGNOSIS — D649 Anemia, unspecified: Secondary | ICD-10-CM | POA: Diagnosis present

## 2016-04-01 DIAGNOSIS — Z3A33 33 weeks gestation of pregnancy: Secondary | ICD-10-CM

## 2016-04-01 LAB — URINALYSIS, ROUTINE W REFLEX MICROSCOPIC
Bilirubin Urine: NEGATIVE
Glucose, UA: NEGATIVE mg/dL
KETONES UR: NEGATIVE mg/dL
LEUKOCYTES UA: NEGATIVE
Nitrite: NEGATIVE
PROTEIN: 100 mg/dL — AB
Specific Gravity, Urine: 1.015 (ref 1.005–1.030)
pH: 6 (ref 5.0–8.0)

## 2016-04-01 LAB — CBC
HCT: 28.6 % — ABNORMAL LOW (ref 36.0–46.0)
HEMOGLOBIN: 9.5 g/dL — AB (ref 12.0–15.0)
MCH: 24 pg — AB (ref 26.0–34.0)
MCHC: 33.2 g/dL (ref 30.0–36.0)
MCV: 72.2 fL — ABNORMAL LOW (ref 78.0–100.0)
Platelets: 203 10*3/uL (ref 150–400)
RBC: 3.96 MIL/uL (ref 3.87–5.11)
RDW: 15.4 % (ref 11.5–15.5)
WBC: 8.2 10*3/uL (ref 4.0–10.5)

## 2016-04-01 LAB — POCT FERN TEST: POCT Fern Test: POSITIVE

## 2016-04-01 LAB — URINE MICROSCOPIC-ADD ON

## 2016-04-01 LAB — WET PREP, GENITAL
Clue Cells Wet Prep HPF POC: NONE SEEN
Sperm: NONE SEEN
TRICH WET PREP: NONE SEEN
WBC, Wet Prep HPF POC: NONE SEEN
Yeast Wet Prep HPF POC: NONE SEEN

## 2016-04-01 LAB — TYPE AND SCREEN
ABO/RH(D): O POS
ANTIBODY SCREEN: NEGATIVE

## 2016-04-01 LAB — AMNISURE RUPTURE OF MEMBRANE (ROM) NOT AT ARMC: AMNISURE: POSITIVE

## 2016-04-01 MED ORDER — BETAMETHASONE SOD PHOS & ACET 6 (3-3) MG/ML IJ SUSP
12.0000 mg | Freq: Once | INTRAMUSCULAR | Status: AC
  Administered 2016-04-01: 12 mg via INTRAMUSCULAR
  Filled 2016-04-01: qty 2

## 2016-04-01 MED ORDER — SODIUM CHLORIDE 0.9 % IV SOLN: 250.0000 mg | Freq: Four times a day (QID) | INTRAVENOUS | Status: DC

## 2016-04-01 MED ORDER — DOCUSATE SODIUM 100 MG PO CAPS
100.0000 mg | ORAL_CAPSULE | Freq: Every day | ORAL | Status: DC
  Administered 2016-04-02 – 2016-04-07 (×6): 100 mg via ORAL
  Filled 2016-04-01 (×6): qty 1

## 2016-04-01 MED ORDER — ERYTHROMYCIN BASE 250 MG PO TABS: 250.0000 mg | ORAL_TABLET | Freq: Four times a day (QID) | ORAL | Status: DC

## 2016-04-01 MED ORDER — LACTATED RINGERS IV SOLN: INTRAVENOUS | Status: DC

## 2016-04-01 MED ORDER — DEXTROSE 5 % IV SOLN
1000.0000 mg | Freq: Once | INTRAVENOUS | Status: AC
  Administered 2016-04-01: 1000 mg via INTRAVENOUS
  Filled 2016-04-01: qty 1000

## 2016-04-01 MED ORDER — PRENATAL MULTIVITAMIN CH
1.0000 | ORAL_TABLET | Freq: Every day | ORAL | Status: DC
  Administered 2016-04-02 – 2016-04-07 (×6): 1 via ORAL
  Filled 2016-04-01 (×6): qty 1

## 2016-04-01 MED ORDER — ZOLPIDEM TARTRATE 5 MG PO TABS: 5.0000 mg | ORAL_TABLET | Freq: Every evening | ORAL | Status: DC | PRN

## 2016-04-01 MED ORDER — AMOXICILLIN 500 MG PO CAPS
500.0000 mg | ORAL_CAPSULE | Freq: Three times a day (TID) | ORAL | Status: DC
  Administered 2016-04-03 – 2016-04-08 (×14): 500 mg via ORAL
  Filled 2016-04-01 (×15): qty 1

## 2016-04-01 MED ORDER — BETAMETHASONE SOD PHOS & ACET 6 (3-3) MG/ML IJ SUSP
12.0000 mg | Freq: Once | INTRAMUSCULAR | Status: AC
  Administered 2016-04-02: 12 mg via INTRAMUSCULAR
  Filled 2016-04-01: qty 2

## 2016-04-01 MED ORDER — CALCIUM CARBONATE ANTACID 500 MG PO CHEW: 2.0000 | CHEWABLE_TABLET | ORAL | Status: DC | PRN

## 2016-04-01 MED ORDER — SODIUM CHLORIDE 0.9 % IV SOLN
2.0000 g | Freq: Four times a day (QID) | INTRAVENOUS | Status: AC
  Administered 2016-04-01 – 2016-04-03 (×8): 2 g via INTRAVENOUS
  Filled 2016-04-01 (×9): qty 2000

## 2016-04-01 MED ORDER — ACETAMINOPHEN 325 MG PO TABS: 650.0000 mg | ORAL_TABLET | ORAL | Status: DC | PRN

## 2016-04-01 NOTE — Progress Notes (Signed)
OB Note  Still breech on beside u/s. D/w pt that would need c/s at 34wks or if labors before that. EFM category I with accels and rare UCs. Pt feels rare, non painful UCs and she is skinny so would be able to pick up UCs.  Pt ok to come of cont. EFM and do routine AP PPROM management.   Melody Mcclure, Jr MD Attending Center for [facility](Faculty Practice)

## 2016-04-01 NOTE — MAU Provider Note (Signed)
History     CSN: [id number]  Arrival date and time: 04/01/16 1546   First Provider Initiated Contact with Patient 04/01/16 1627      Chief Complaint  Patient presents with  . Rupture of Membranes   HPI Melody Mcclure is a 25 y.o. G1P0 at 161w0d who presents with leaking of fluid. Reports leaking began at 1 pm today. States she has soaked through 2 pairs of pants & continues to leak. Fluid is clear & thin; no odor. Denies abdominal pain, contractions, or vaginal bleeding. Denies recent intercourse. Positive fetal movement.  OB History    Gravida Para Term Preterm AB Living   1         0   SAB TAB Ectopic Multiple Live Births                   Past Medical History:  Diagnosis Date  . 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 History of syncope    last episode 2014  . Palpitations   . Pyelonephritis   . SOB (shortness of breath)     Past Surgical History:  Procedure Laterality Date  . NO PAST SURGERIES      Family History  Problem Relation Age of Onset  . Asthma Brother   . Cancer Paternal Grandfather     Lung cancer    Social History  Substance Use Topics  . Smoking status: Former Games developermoker  . Smokeless tobacco: Never Used  . Alcohol use No     Comment: on occasion    Allergies:  Allergies  Allergen Reactions  . Dilaudid [Hydromorphone Hcl] Itching    Prescriptions Prior to Admission  Medication Sig Dispense Refill Last Dose  . cefadroxil (DURICEF) 500 MG capsule Take 1 capsule (500 mg total) by mouth 2 (two) times daily. 20 capsule 0 Taking  . cyclobenzaprine (FLEXERIL) 10 MG tablet Take 1 tablet (10 mg total) by mouth every 8 (eight) hours as needed for muscle spasms. 30 tablet 1 Taking  . nitrofurantoin, macrocrystal-monohydrate, (MACROBID) 100 MG capsule Take 1 capsule (100 mg total) by mouth at bedtime. Start after completion of cefadroxil 30 capsule 2 Taking  . oxyCODONE-acetaminophen (PERCOCET/ROXICET) 5-325 MG tablet Take 1 tablet by mouth every 6 (six) hours as  needed for severe pain. 12 tablet 0 Taking  . Prenatal Vit-Fe Fumarate-FA (PRENATAL MULTIVITAMIN) TABS tablet Take 1 tablet by mouth at bedtime.   Taking  . progesterone (PROMETRIUM) 200 MG capsule Place 200 mg vaginally at bedtime.   Taking    Review of Systems  Constitutional: Negative for chills and fever.  Gastrointestinal: Negative.   Genitourinary: Negative for dysuria.       + LOF No Vaginal bleeding   Physical Exam   Blood pressure 114/73, pulse 112, temperature 97.9 F (36.6 C), temperature source Oral, resp. rate 16, last menstrual period 07/24/2015.  Physical Exam  Nursing note and vitals reviewed. Constitutional: She is oriented to person, place, and time. She appears well-developed and well-nourished. No distress.  HENT:  Head: Normocephalic and atraumatic.  Eyes: Conjunctivae are normal. Right eye exhibits no discharge. Left eye exhibits no discharge. No scleral icterus.  Neck: Normal range of motion.  Cardiovascular: Normal rate, regular rhythm and normal heart sounds.   No murmur heard. Respiratory: Effort normal and breath sounds normal. No respiratory distress. She has no wheezes.  GI: Soft.  Genitourinary:  Genitourinary Comments: + pooling moderate amount of clear fluid  Neurological: She is alert and oriented to person, place, and  time.  Skin: Skin is warm and dry. She is not diaphoretic.  Psychiatric: She has a normal mood and affect. Her behavior is normal. Judgment and thought content normal.   Fetal Tracing:  Baseline: 130 Variability: moderate Accelerations: 15x15 Decelerations: none  Toco: irregular ctx; not appreciated by pt MAU Course  Procedures Results for orders placed or performed during the hospital encounter of 04/01/16 (from the past 24 hour(s))  Wet prep, genital     Status: None   Collection Time: 04/01/16  4:44 PM  Result Value Ref Range   Yeast Wet Prep HPF POC NONE SEEN NONE SEEN   Trich, Wet Prep NONE SEEN NONE SEEN   Clue  Cells Wet Prep HPF POC NONE SEEN NONE SEEN   WBC, Wet Prep HPF POC NONE SEEN NONE SEEN   Sperm NONE SEEN   Fern Test     Status: Abnormal   Collection Time: 04/01/16  5:05 PM  Result Value Ref Range   POCT Fern Test Positive = ruptured amniotic membanes     MDM Category 1 tracing + pooling & fern GC/CT & wet prep collected S/w Dr. Alvester MorinNewton; will admit patient Discussed patient with Dr. Cleatis PolkaAuten (neonatology); pt is candidate for admission here & will not need to be transferred out  Assessment and Plan  A:  1. Preterm premature rupture of membranes (PPROM) with unknown onset of labor   2. Uterus bicornis affecting pregnancy in second trimester    P: Admit to antenatal IV antibiotics GBS collected BMZ #1  Melody Mcclure 04/01/2016, 4:26 PM

## 2016-04-01 NOTE — MAU Note (Signed)
Started leaking around 1300, clear fluid still coming. Changed clothes, pants are wet. No bleeding, no pain.

## 2016-04-02 DIAGNOSIS — O99013 Anemia complicating pregnancy, third trimester: Secondary | ICD-10-CM | POA: Diagnosis present

## 2016-04-02 DIAGNOSIS — O42919 Preterm premature rupture of membranes, unspecified as to length of time between rupture and onset of labor, unspecified trimester: Secondary | ICD-10-CM

## 2016-04-02 LAB — HIV ANTIBODY (ROUTINE TESTING W REFLEX): HIV SCREEN 4TH GENERATION: NONREACTIVE

## 2016-04-02 LAB — RPR: RPR Ser Ql: NONREACTIVE

## 2016-04-02 MED ORDER — FERROUS GLUCONATE 324 (38 FE) MG PO TABS
324.0000 mg | ORAL_TABLET | Freq: Two times a day (BID) | ORAL | Status: DC
  Administered 2016-04-02 – 2016-04-07 (×12): 324 mg via ORAL
  Filled 2016-04-02 (×15): qty 1

## 2016-04-02 NOTE — Progress Notes (Addendum)
Daily Antepartum Note  Admission Date: 04/01/2016 Current Date: 04/02/2016 7:02 AM  Melody Mcclure is a 25 y.o. G1 @ 1854w1d , HD#2, admitted for PPROM @ 33/0.  Pregnancy complicated by: Patient Active Problem List   Diagnosis Date Noted  . Preterm premature rupture of membranes (PPROM) with unknown onset of labor 04/01/2016  . Anemia affecting pregnancy 03/15/2016  . Short cervix, antepartum 01/29/2016  . Supervision of high risk pregnancy, antepartum 10/23/2015  . 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   Objective:    Current Vital Signs 24h Vital Sign Ranges  T 98.2 F (36.8 C) Temp  Avg: 98.2 F (36.8 C)  Min: 97.9 F (36.6 C)  Max: 98.5 F (36.9 C)  BP 112/65 BP  Min: 112/65  Max: 125/81  HR 81 Pulse  Avg: 93.3  Min: 78  Max: 112  RR 16 Resp  Avg: 15.5  Min: 14  Max: 16  SaO2     No Data Recorded       24 Hour I/O Current Shift I/O  Time Ins Outs No intake/output data recorded. No intake/output data recorded.   Physical exam: General: Well nourished, well developed female in no acute distress. Abdomen: gravid nttp Respiratory: no resp distress Extremities: no clubbing, cyanosis or edema Skin: Warm and dry.   Medications: Current Facility-Administered Medications  Medication Dose Route Frequency Provider Last Rate Last Dose  . acetaminophen (TYLENOL) tablet 650 mg  650 mg Oral Q4H PRN Judeth HornErin Lawrence, NP      . ampicillin (OMNIPEN) 2 g in sodium chloride 0.9 % 50 mL IVPB  2 g Intravenous Q6H Judeth HornErin Lawrence, NP   2 g at 04/02/16 [id number]   Followed by  . [START ON 04/03/2016] amoxicillin (AMOXIL) capsule 500 mg  500 mg Oral Q8H Judeth HornErin Lawrence, NP      . betamethasone acetate-betamethasone sodium phosphate (CELESTONE) injection 12 mg  12 mg Intramuscular Once Judeth HornErin Lawrence, NP      . calcium carbonate (TUMS - dosed in mg elemental calcium) chewable tablet 400 mg of elemental calcium  2  tablet Oral Q4H PRN Judeth HornErin Lawrence, NP      . docusate sodium (COLACE) capsule 100 mg  100 mg Oral Daily Judeth HornErin Lawrence, NP      . prenatal multivitamin tablet 1 tablet  1 tablet Oral Q1200 Judeth HornErin Lawrence, NP      . zolpidem (AMBIEN) tablet 5 mg  5 mg Oral QHS PRN Judeth HornErin Lawrence, NP        Labs:   Recent Labs Lab 04/01/16 1805  WBC 8.2  HGB 9.5*  HCT 28.6*  PLT 203   Negative: HIV, rpr, u/a  Wet prep: negative  Pending: GBS  Radiology: 11/23 bedside u/s breech 11/3: 62% efw, 1600gm, AC 77%, nl AFI. Pregnancy in the left horn  Assessment & Plan:  Pt stable *Pregnancy: routine care. tdap utd. q shift NSTs *PPROM: continue D#2 of latency abx. folllow up growth ordered for 11/24 -no SVE done on admission or mention if cervix was visualized during exam with just +fluid noted.  *Preterm: rescue BMZ #2 today. NICU consulted *Anemia: bid iron ordered *PPx: SCDs, OOB ad lib *FEN/GI: regular diet *Dispo: delivery at 34wks.  Cornelia Copaharlie Marguarite Markov, Jr. MD Attending Center for [facility](Faculty Practice)

## 2016-04-03 ENCOUNTER — Inpatient Hospital Stay (HOSPITAL_COMMUNITY): Payer: Medicaid Other

## 2016-04-03 LAB — CULTURE, BETA STREP (GROUP B ONLY)

## 2016-04-03 LAB — GC/CHLAMYDIA PROBE AMP (~~LOC~~) NOT AT ARMC
CHLAMYDIA, DNA PROBE: NEGATIVE
NEISSERIA GONORRHEA: NEGATIVE

## 2016-04-03 NOTE — Consult Note (Signed)
Neonatology Consult to Antenatal Patient: 04/03/2016 7:58 PM    I was requested by Dr Vergie LivingPickens to see this patient in order to provide antenatal counseling due to  PPROM at 33 2/[redacted] weeks gestation.    Ms. Melody Mcclure is a  25 y/o Primigravida who was admitted on 11/22 and is now 33 2/[redacted] weeks GA.  Pregnancy complicated by bicornuate uterus, short cervix, anemia, breech presentation and PPROM since 11/22.  She is currently "not" having active labor.  She received a course of BMZ last 9/20 and 9/21 and 11/22 and 11/23.   She is also receiving Ampicillin since admission.   I spoke with Melody Mcclure in Room 152.   We discussed in detail what to expect in case of possible delivery of the infant in the next few days including morbidity and mortality at this gestational age, usual delivery room resuscitation including possible need for PPV in the DR.  Discussed possible respiratory complications and need for oxygen support, IV access, sepsis work-up, NG/OG feedings ( benefits of BF and use of DBM until MOB supply is appropriate), temperature and blood glucose stability, risk for IVH with the potential for motor/cognitive deficits, length of stay and long-term outcome.   She was attentive, had appropriate questions, and expressed appreciation for my input.   I offered a NICU tour to any interested family members and would be glad to come back if she has more questions later.  Thank you for asking us to see this patient and allowing us to participate in her care.  Please call if there are any further questions.   Overton MamMary Ann T Dimaguila, MD (Attending Neonatologist)  Total length of face-to-face or floor/unit time for this encounter was 30 minutes. Counseling and/or coordination of care was greater than fifty percent of the time.

## 2016-04-03 NOTE — Progress Notes (Signed)
FACULTY PRACTICE ANTEPARTUM(COMPREHENSIVE) NOTE  Melody Mcclure is a 25 y.o. G1P0 at 6718w2d who is admitted for  Patient Active Problem List   Diagnosis Date Noted  . Preterm premature rupture of membranes (PPROM) with unknown onset of labor 04/01/2016  . Anemia affecting pregnancy 03/15/2016  . Short cervix, antepartum 01/29/2016  . Supervision of high risk pregnancy, antepartum 10/23/2015  . 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   Fetal presentation is breech. Confirmed again yesterday by Dr. Lowell GuitarPickens Length of Stay:  2  Days  Subjective: The patient reports occasional feeling of a mild contraction Patient reports the fetal movement as active. Patient reports uterine contraction  activity as rare . Patient reports  vaginal bleeding as none. Patient describes fluid per vagina as Clear.  Vitals:  Blood pressure 104/68, pulse 94, temperature 98.6 F (37 C), temperature source Oral, resp. rate 16, height 5\' 7"  (1.702 m), weight 67.6 kg (149 lb), last menstrual period 07/24/2015. Physical Examination:  General appearance - alert, well appearing, and in no distress Heart - normal rate and regular rhythm Abdomen - soft, nontender, nondistended Fundal Height:  size equals dates Cervical Exam: Not evaluated. and  Extremities: extremities normal, atraumatic, no cyanosis or edema and Homans sign is negative, no sign of DVT with DTRs 2+ bilaterally Membranes:i ruptured  Fetal Monitoring:  Baseline: 110 bpm  Labs:  No results found for this or any previous visit (from the past 24 hour(s)).  Imaging Studies:     Currently EPIC will not allow sonographic studies to automatically populate into notes.  In the meantime, copy and paste results into note or free text.  Medications:  Scheduled . ampicillin (OMNIPEN) IV  2 g Intravenous Q6H   Followed by  . amoxicillin  500 mg Oral Q8H  . docusate sodium  100 mg Oral Daily  . ferrous gluconate  324 mg Oral BID WC  .  prenatal multivitamin  1 tablet Oral Q1200   I have reviewed the patient's current medications.  ASSESSMENT: Patient Active Problem List   Diagnosis Date Noted  . Anemia affecting pregnancy in third trimester 04/02/2016  . Preterm premature rupture of membranes (PPROM) with unknown onset of labor 04/01/2016  . Short cervix, antepartum 01/29/2016  . Supervision of high risk pregnancy, antepartum 10/23/2015  . 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 [facility] Daphene CalamityAnyanwu  [name] 04/03/2016,10:05 AM    Patient ID: Melody Mcclure, female   DOB: 1991/01/10, 25 y.o.   MRN: [id number]

## 2016-04-04 LAB — TYPE AND SCREEN
ABO/RH(D): O POS
ANTIBODY SCREEN: NEGATIVE

## 2016-04-04 MED ORDER — SODIUM CHLORIDE 0.9% FLUSH: 3.0000 mL | INTRAVENOUS | Status: DC | PRN

## 2016-04-04 MED ORDER — SODIUM CHLORIDE 0.9% FLUSH
3.0000 mL | Freq: Two times a day (BID) | INTRAVENOUS | Status: DC
  Administered 2016-04-04 – 2016-04-08 (×9): 3 mL via INTRAVENOUS

## 2016-04-04 MED ORDER — PROMETHAZINE HCL 25 MG PO TABS
25.0000 mg | ORAL_TABLET | Freq: Four times a day (QID) | ORAL | Status: DC | PRN
  Administered 2016-04-04: 25 mg via ORAL
  Filled 2016-04-04: qty 1

## 2016-04-04 NOTE — Progress Notes (Signed)
FACULTY PRACTICE ANTEPARTUM(COMPREHENSIVE) NOTE  Melody Mcclure is a 25 y.o. G1P0 at 233w3d  who is admitted for  Patient Active Problem List   Diagnosis Date Noted  . Preterm premature rupture of membranes (PPROM) with unknown onset of labor 04/01/2016  . Anemia affecting pregnancy 03/15/2016  . Short cervix, antepartum 01/29/2016  . Supervision of high risk pregnancy, antepartum 10/23/2015  . 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   Fetal presentation is breech. Confirmed again yesterday by Dr. Lowell GuitarPickens Length of Stay:  3  Days  Subjective: The patient reports occasional feeling of a mild contraction Patient reports the fetal movement as active. Patient reports uterine contraction  activity as rare . Patient reports  vaginal bleeding as none. Patient describes fluid per vagina as Clear.  Vitals:  Blood pressure 121/75, pulse 94, temperature 98.3 F (36.8 C), resp. rate 18, height 5\' 7"  (1.702 m), weight 149 lb (67.6 kg), last menstrual period 07/24/2015, SpO2 99 %. Physical Examination:  General appearance - alert, well appearing, and in no distress Heart - normal rate and regular rhythm Abdomen - soft, nontender, nondistended Fundal Height:  size equals dates Cervical Exam: Not evaluated. and  Extremities: extremities normal, atraumatic, no cyanosis or edema and Homans sign is negative, no sign of DVT with DTRs 2+ bilaterally Membranes:i ruptured  Fetal Monitoring:  Baseline: 110 bpm  Labs:  No results found for this or any previous visit (from the past 24 hour(s)).  Imaging Studies:     Currently EPIC will not allow sonographic studies to automatically populate into notes.  In the meantime, copy and paste results into note or free text.  Medications:  Scheduled . amoxicillin  500 mg Oral Q8H  . docusate sodium  100 mg Oral Daily  . ferrous gluconate  324 mg Oral BID WC  . prenatal multivitamin  1 tablet Oral Q1200   I have reviewed the patient's  current medications.  ASSESSMENT: Patient Active Problem List   Diagnosis Date Noted  . Anemia affecting pregnancy in third trimester 04/02/2016  . Preterm premature rupture of membranes (PPROM) with unknown onset of labor 04/01/2016  . Short cervix, antepartum 01/29/2016  . Supervision of high risk pregnancy, antepartum 10/23/2015  . 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 [facility] Vernice JeffersonAnyanwu  Jahson Emanuele H 04/04/2016,8:30 AM    Patient ID: Melody KidAlesha Suppes, female   DOB: [birth year], 25 y.o.   MRN: [id number] Patient ID: Melody Kidlesha Riggsbee, female   DOB: [birth year], 25 y.o.   MRN: [id number]

## 2016-04-04 NOTE — Progress Notes (Addendum)
Pt has complaints of nausea, and 2-3 contractions /hr.  BP 113/74 (BP Location: Right Arm)   Pulse 90   Temp 97.8 F (36.6 C) (Oral)   Resp 17   Ht 5\' 7"  (1.702 m)   Wt 67.6 kg (149 lb)   LMP 07/24/2015 (Approximate)   SpO2 99%   BMI 23.34 kg/m   Will give pt po phenergan at this time.

## 2016-04-05 MED ORDER — TERBUTALINE SULFATE 1 MG/ML IJ SOLN
0.2500 mg | Freq: Once | INTRAMUSCULAR | Status: AC
  Administered 2016-04-05: 0.25 mg via SUBCUTANEOUS
  Filled 2016-04-05: qty 1

## 2016-04-05 NOTE — Progress Notes (Signed)
FACULTY PRACTICE ANTEPARTUM(COMPREHENSIVE) NOTE  Melody Mcclure is a 25 y.o. G1P0 at 5031w4d  who is admitted for rupture of membranes, PROM.   Fetal presentation is breech. Length of Stay:  4  Days  Subjective: Pt without c/o Patient reports the fetal movement as active. Patient reports uterine contraction  activity as none. Patient reports  vaginal bleeding as none. Patient describes fluid per vagina as None.  Vitals:  Blood pressure (!) 96/59, pulse 99, temperature 97.9 F (36.6 C), temperature source Oral, resp. rate 18, height 5\' 7"  (1.702 m), weight 67.6 kg (149 lb), last menstrual period 07/24/2015, SpO2 99 %. Physical Examination:  General appearance - alert, well appearing, and in no distress, oriented to person, place, and time and normal appearing weight Heart - normal rate and regular rhythm Abdomen - soft, nontender, nondistended Fundal Height:  size greater than dates Cervical Exam: Not evaluated. and found to be / / and fetal presentation is . Extremities: extremities normal, atraumatic, no cyanosis or edema and Homans sign is negative, no sign of DVT with DTRs 2+ bilaterally Membranes:ruptured, clear fluid  Fetal Monitoring:  Baseline: 145 bpm  Labs:  Results for orders placed or performed during the hospital encounter of 04/01/16 (from the past 24 hour(s))  Type and screen [facility]   Collection Time: 04/04/16  4:40 PM  Result Value Ref Range   ABO/RH(D) O POS    Antibody Screen NEG    Sample Expiration 04/07/2016     Imaging Studies:     Currently EPIC will not allow sonographic studies to automatically populate into notes.  In the meantime, copy and paste results into note or free text.  Medications:  Scheduled . amoxicillin  500 mg Oral Q8H  . docusate sodium  100 mg Oral Daily  . ferrous gluconate  324 mg Oral BID WC  . prenatal multivitamin  1 tablet Oral Q1200  . sodium chloride flush  3 mL Intravenous Q12H   I have reviewed the  patient's current medications.  ASSESSMENT: Patient Active Problem List   Diagnosis Date Noted  . Anemia affecting pregnancy in third trimester 04/02/2016  . Preterm premature rupture of membranes (PPROM) with unknown onset of labor 04/01/2016  . Short cervix, antepartum 01/29/2016  . Supervision of high risk pregnancy, antepartum 10/23/2015  . 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

## 2016-04-06 NOTE — Progress Notes (Signed)
FACULTY PRACTICE ANTEPARTUM(COMPREHENSIVE) NOTE  Melody Mcclure is a 25 y.o. G1P0 at 6961w5d   who is admitted for rupture of membranes, PROM.   Fetal presentation is breech. Length of Stay:  5  Days  Subjective: Pt without c/o Patient reports the fetal movement as active. Patient reports uterine contraction  activity as none. Patient reports  vaginal bleeding as none. Patient describes fluid per vagina as None.  Vitals:  Blood pressure 116/71, pulse (!) 106, temperature 97.9 F (36.6 C), temperature source Oral, resp. rate 18, height 5\' 7"  (1.702 m), weight 149 lb (67.6 kg), last menstrual period 07/24/2015, SpO2 98 %. Physical Examination:  General appearance - alert, well appearing, and in no distress, oriented to person, place, and time and normal appearing weight Heart - normal rate and regular rhythm Abdomen - soft, nontender, nondistended Fundal Height:  size greater than dates Cervical Exam: Not evaluated. and found to be / / and fetal presentation is . Extremities: extremities normal, atraumatic, no cyanosis or edema and Homans sign is negative, no sign of DVT with DTRs 2+ bilaterally Membranes:ruptured, clear fluid  Fetal Monitoring:  Baseline: 145 bpm  Labs:  No results found for this or any previous visit (from the past 24 hour(s)).  Imaging Studies:     Currently EPIC will not allow sonographic studies to automatically populate into notes.  In the meantime, copy and paste results into note or free text.  Medications:  Scheduled . amoxicillin  500 mg Oral Q8H  . docusate sodium  100 mg Oral Daily  . ferrous gluconate  324 mg Oral BID WC  . prenatal multivitamin  1 tablet Oral Q1200  . sodium chloride flush  3 mL Intravenous Q12H   I have reviewed the patient's current medications.  ASSESSMENT: Patient Active Problem List   Diagnosis Date Noted  . Anemia affecting pregnancy in third trimester 04/02/2016  . Preterm premature rupture of membranes (PPROM) with unknown  onset of labor 04/01/2016  . Short cervix, antepartum 01/29/2016  . Supervision of high risk pregnancy, antepartum 10/23/2015  . 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   MRN: [id number]

## 2016-04-07 LAB — TYPE AND SCREEN
ABO/RH(D): O POS
Antibody Screen: NEGATIVE

## 2016-04-07 NOTE — Progress Notes (Signed)
ACULTY PRACTICE ANTEPARTUM COMPREHENSIVE PROGRESS NOTE  Melody Mcclure is a 25 y.o. G1P0 at 3530w6d  who is admitted for PPROM.    Fetal presentation is breech. Length of Stay:  6  Days  Subjective: Pt without complaints this morning. She reports good fetal movement. Continues to leak some fluid at times. Denies ut ctx.   Vitals:  Blood pressure 98/64, pulse 96, temperature 97.3 F (36.3 C), temperature source Oral, resp. rate 20, height 5\' 7"  (1.702 m), weight 67.6 kg (149 lb), last menstrual period 07/24/2015, SpO2 97 %. Physical Examination: AF VSS Lungs clear Heart RRR  Abd soft + BS gravid non tender Ext non tender  Fetal Monitoring:  Baseline: 120-150's bpm  Labs:  No results found for this or any previous visit (from the past 24 hour(s)).  Imaging Studies:       Medications:  Scheduled . amoxicillin  500 mg Oral Q8H  . docusate sodium  100 mg Oral Daily  . ferrous gluconate  324 mg Oral BID WC  . prenatal multivitamin  1 tablet Oral Q1200  . sodium chloride flush  3 mL Intravenous Q12H   I have reviewed the patient's current medications.  ASSESSMENT: Patient Active Problem List   Diagnosis Date Noted  . Anemia affecting pregnancy in third trimester 04/02/2016  . Preterm premature rupture of membranes (PPROM) with unknown onset of labor 04/01/2016  . Short cervix, antepartum 01/29/2016  . Supervision of high risk pregnancy, antepartum 10/23/2015  . 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   Continue routine antenatal care.   Hermina StaggersMichael L Bertram Haddix 04/07/2016,7:12 AM

## 2016-04-08 ENCOUNTER — Encounter (HOSPITAL_COMMUNITY): Payer: Self-pay | Admitting: *Deleted

## 2016-04-08 ENCOUNTER — Encounter (HOSPITAL_COMMUNITY)
Admission: AD | Disposition: A | Discharge status: Home / Self Care | Payer: Self-pay | Source: Ambulatory Visit | Attending: Obstetrics & Gynecology

## 2016-04-08 ENCOUNTER — Inpatient Hospital Stay (HOSPITAL_COMMUNITY): Payer: Medicaid Other | Admitting: Anesthesiology

## 2016-04-08 DIAGNOSIS — Z98891 History of uterine scar from previous surgery: Secondary | ICD-10-CM

## 2016-04-08 DIAGNOSIS — Z3A34 34 weeks gestation of pregnancy: Secondary | ICD-10-CM

## 2016-04-08 DIAGNOSIS — O321XX0 Maternal care for breech presentation, not applicable or unspecified: Secondary | ICD-10-CM

## 2016-04-08 DIAGNOSIS — O42013 Preterm premature rupture of membranes, onset of labor within 24 hours of rupture, third trimester: Secondary | ICD-10-CM

## 2016-04-08 LAB — CBC
HCT: 35.3 % — ABNORMAL LOW (ref 36.0–46.0)
HEMOGLOBIN: 11.7 g/dL — AB (ref 12.0–15.0)
MCH: 23.8 pg — ABNORMAL LOW (ref 26.0–34.0)
MCHC: 33.1 g/dL (ref 30.0–36.0)
MCV: 71.9 fL — ABNORMAL LOW (ref 78.0–100.0)
PLATELETS: 209 10*3/uL (ref 150–400)
RBC: 4.91 MIL/uL (ref 3.87–5.11)
RDW: 15.8 % — AB (ref 11.5–15.5)
WBC: 8.3 10*3/uL (ref 4.0–10.5)

## 2016-04-08 SURGERY — Surgical Case
Anesthesia: Spinal | Site: Abdomen | Wound class: Clean Contaminated

## 2016-04-08 MED ORDER — DIBUCAINE 1 % RE OINT: 1.0000 "application " | TOPICAL_OINTMENT | RECTAL | Status: DC | PRN

## 2016-04-08 MED ORDER — DIPHENHYDRAMINE HCL 25 MG PO CAPS
25.0000 mg | ORAL_CAPSULE | Freq: Four times a day (QID) | ORAL | Status: DC | PRN
  Administered 2016-04-08: 25 mg via ORAL

## 2016-04-08 MED ORDER — OXYTOCIN 10 UNIT/ML IJ SOLN
INTRAMUSCULAR | Status: AC
  Filled 2016-04-08: qty 4

## 2016-04-08 MED ORDER — MENTHOL 3 MG MT LOZG
1.0000 | LOZENGE | OROMUCOSAL | Status: DC | PRN
  Filled 2016-04-08: qty 9

## 2016-04-08 MED ORDER — OXYTOCIN 40 UNITS IN LACTATED RINGERS INFUSION - SIMPLE MED: 2.5000 [IU]/h | INTRAVENOUS | Status: AC

## 2016-04-08 MED ORDER — PROMETHAZINE HCL 25 MG/ML IJ SOLN
INTRAMUSCULAR | Status: AC
  Filled 2016-04-08: qty 1

## 2016-04-08 MED ORDER — SIMETHICONE 80 MG PO CHEW
80.0000 mg | CHEWABLE_TABLET | ORAL | Status: DC
  Administered 2016-04-08 – 2016-04-09 (×2): 80 mg via ORAL
  Filled 2016-04-08 (×2): qty 1

## 2016-04-08 MED ORDER — SODIUM CHLORIDE 0.9% FLUSH
INTRAVENOUS | Status: AC
  Filled 2016-04-08: qty 9

## 2016-04-08 MED ORDER — MORPHINE SULFATE-NACL 0.5-0.9 MG/ML-% IV SOSY
PREFILLED_SYRINGE | INTRAVENOUS | Status: AC
  Filled 2016-04-08: qty 1

## 2016-04-08 MED ORDER — LACTATED RINGERS IV SOLN
INTRAVENOUS | Status: DC | PRN
  Administered 2016-04-08: 11:00:00 via INTRAVENOUS

## 2016-04-08 MED ORDER — BUPIVACAINE IN DEXTROSE 0.75-8.25 % IT SOLN
INTRATHECAL | Status: DC | PRN
  Administered 2016-04-08: 1.6 mL via INTRATHECAL

## 2016-04-08 MED ORDER — ACETAMINOPHEN 500 MG PO TABS
1000.0000 mg | ORAL_TABLET | Freq: Four times a day (QID) | ORAL | Status: AC
  Administered 2016-04-08 – 2016-04-09 (×3): 1000 mg via ORAL
  Filled 2016-04-08 (×3): qty 2

## 2016-04-08 MED ORDER — ONDANSETRON HCL 4 MG/2ML IJ SOLN
INTRAMUSCULAR | Status: DC | PRN
  Administered 2016-04-08: 4 mg via INTRAVENOUS

## 2016-04-08 MED ORDER — LACTATED RINGERS IV SOLN
INTRAVENOUS | Status: DC
  Administered 2016-04-08: 10:00:00 via INTRAVENOUS

## 2016-04-08 MED ORDER — NALOXONE HCL 0.4 MG/ML IJ SOLN: 0.4000 mg | INTRAMUSCULAR | Status: DC | PRN

## 2016-04-08 MED ORDER — NALBUPHINE HCL 10 MG/ML IJ SOLN: 5.0000 mg | Freq: Once | INTRAMUSCULAR | Status: DC | PRN

## 2016-04-08 MED ORDER — FENTANYL CITRATE (PF) 100 MCG/2ML IJ SOLN
INTRAMUSCULAR | Status: DC | PRN
  Administered 2016-04-08: 10 ug via INTRATHECAL

## 2016-04-08 MED ORDER — SIMETHICONE 80 MG PO CHEW: 80.0000 mg | CHEWABLE_TABLET | ORAL | Status: DC | PRN

## 2016-04-08 MED ORDER — SOD CITRATE-CITRIC ACID 500-334 MG/5ML PO SOLN
30.0000 mL | Freq: Once | ORAL | Status: AC
  Administered 2016-04-08: 30 mL via ORAL
  Filled 2016-04-08: qty 15

## 2016-04-08 MED ORDER — TETANUS-DIPHTH-ACELL PERTUSSIS 5-2.5-18.5 LF-MCG/0.5 IM SUSP
0.5000 mL | Freq: Once | INTRAMUSCULAR | Status: DC
  Filled 2016-04-08: qty 0.5

## 2016-04-08 MED ORDER — MORPHINE SULFATE (PF) 0.5 MG/ML IJ SOLN
INTRAMUSCULAR | Status: DC | PRN
  Administered 2016-04-08: .2 mg via INTRATHECAL

## 2016-04-08 MED ORDER — ZOLPIDEM TARTRATE 5 MG PO TABS: 5.0000 mg | ORAL_TABLET | Freq: Every evening | ORAL | Status: DC | PRN

## 2016-04-08 MED ORDER — MEASLES, MUMPS & RUBELLA VAC ~~LOC~~ INJ
0.5000 mL | INJECTION | Freq: Once | SUBCUTANEOUS | Status: DC
  Filled 2016-04-08: qty 0.5

## 2016-04-08 MED ORDER — SENNOSIDES-DOCUSATE SODIUM 8.6-50 MG PO TABS
2.0000 | ORAL_TABLET | ORAL | Status: DC
  Administered 2016-04-08 – 2016-04-09 (×2): 2 via ORAL
  Filled 2016-04-08 (×2): qty 2

## 2016-04-08 MED ORDER — KETOROLAC TROMETHAMINE 30 MG/ML IJ SOLN
30.0000 mg | Freq: Four times a day (QID) | INTRAMUSCULAR | Status: AC | PRN
  Administered 2016-04-08: 30 mg via INTRAMUSCULAR
  Filled 2016-04-08: qty 1

## 2016-04-08 MED ORDER — DIPHENHYDRAMINE HCL 25 MG PO CAPS
25.0000 mg | ORAL_CAPSULE | ORAL | Status: DC | PRN
  Filled 2016-04-08: qty 1

## 2016-04-08 MED ORDER — CYCLOBENZAPRINE HCL 10 MG PO TABS: 10.0000 mg | ORAL_TABLET | Freq: Three times a day (TID) | ORAL | Status: DC | PRN

## 2016-04-08 MED ORDER — KETOROLAC TROMETHAMINE 30 MG/ML IJ SOLN
INTRAMUSCULAR | Status: AC
  Filled 2016-04-08: qty 1

## 2016-04-08 MED ORDER — BUPIVACAINE HCL (PF) 0.5 % IJ SOLN
INTRAMUSCULAR | Status: DC | PRN
  Administered 2016-04-08: 30 mL

## 2016-04-08 MED ORDER — IBUPROFEN 600 MG PO TABS
600.0000 mg | ORAL_TABLET | Freq: Four times a day (QID) | ORAL | Status: DC
  Administered 2016-04-08 – 2016-04-10 (×8): 600 mg via ORAL
  Filled 2016-04-08 (×9): qty 1

## 2016-04-08 MED ORDER — ERYTHROMYCIN 5 MG/GM OP OINT
TOPICAL_OINTMENT | OPHTHALMIC | Status: AC
  Filled 2016-04-08: qty 1

## 2016-04-08 MED ORDER — DIPHENHYDRAMINE HCL 50 MG/ML IJ SOLN: 12.5000 mg | INTRAMUSCULAR | Status: DC | PRN

## 2016-04-08 MED ORDER — NALBUPHINE HCL 10 MG/ML IJ SOLN
5.0000 mg | INTRAMUSCULAR | Status: DC | PRN
  Administered 2016-04-08: 5 mg via INTRAVENOUS
  Filled 2016-04-08: qty 1

## 2016-04-08 MED ORDER — LACTATED RINGERS IV SOLN
INTRAVENOUS | Status: DC | PRN
  Administered 2016-04-08 (×2): via INTRAVENOUS

## 2016-04-08 MED ORDER — FENTANYL CITRATE (PF) 100 MCG/2ML IJ SOLN: 25.0000 ug | INTRAMUSCULAR | Status: DC | PRN

## 2016-04-08 MED ORDER — LACTATED RINGERS IV SOLN
INTRAVENOUS | Status: DC
  Administered 2016-04-08 – 2016-04-09 (×2): via INTRAVENOUS

## 2016-04-08 MED ORDER — NALBUPHINE HCL 10 MG/ML IJ SOLN: 5.0000 mg | INTRAMUSCULAR | Status: DC | PRN

## 2016-04-08 MED ORDER — MAGNESIUM HYDROXIDE 400 MG/5ML PO SUSP: 30.0000 mL | ORAL | Status: DC | PRN

## 2016-04-08 MED ORDER — MEPERIDINE HCL 25 MG/ML IJ SOLN: 6.2500 mg | INTRAMUSCULAR | Status: DC | PRN

## 2016-04-08 MED ORDER — ONDANSETRON HCL 4 MG/2ML IJ SOLN
INTRAMUSCULAR | Status: AC
  Filled 2016-04-08: qty 2

## 2016-04-08 MED ORDER — OXYCODONE-ACETAMINOPHEN 5-325 MG PO TABS
1.0000 | ORAL_TABLET | ORAL | Status: DC | PRN
  Administered 2016-04-09: 1 via ORAL
  Filled 2016-04-08 (×2): qty 1

## 2016-04-08 MED ORDER — COCONUT OIL OIL: 1.0000 "application " | TOPICAL_OIL | Status: DC | PRN

## 2016-04-08 MED ORDER — SODIUM CHLORIDE 0.9 % IR SOLN
Status: DC | PRN
  Administered 2016-04-08: 1000 mL

## 2016-04-08 MED ORDER — ACETAMINOPHEN 325 MG PO TABS: 650.0000 mg | ORAL_TABLET | ORAL | Status: DC | PRN

## 2016-04-08 MED ORDER — WITCH HAZEL-GLYCERIN EX PADS: 1.0000 "application " | MEDICATED_PAD | CUTANEOUS | Status: DC | PRN

## 2016-04-08 MED ORDER — LACTATED RINGERS IV SOLN
INTRAVENOUS | Status: DC | PRN
  Administered 2016-04-08: 40 [IU] via INTRAVENOUS

## 2016-04-08 MED ORDER — CEFAZOLIN SODIUM-DEXTROSE 2-3 GM-% IV SOLR
INTRAVENOUS | Status: DC | PRN
  Administered 2016-04-08: 2 g via INTRAVENOUS

## 2016-04-08 MED ORDER — PRENATAL MULTIVITAMIN CH
1.0000 | ORAL_TABLET | Freq: Every day | ORAL | Status: DC
  Administered 2016-04-08 – 2016-04-10 (×3): 1 via ORAL
  Filled 2016-04-08 (×3): qty 1

## 2016-04-08 MED ORDER — OXYCODONE-ACETAMINOPHEN 5-325 MG PO TABS
2.0000 | ORAL_TABLET | ORAL | Status: DC | PRN
  Administered 2016-04-08 – 2016-04-10 (×4): 2 via ORAL
  Filled 2016-04-08 (×5): qty 2

## 2016-04-08 MED ORDER — CEFAZOLIN SODIUM-DEXTROSE 2-4 GM/100ML-% IV SOLN
INTRAVENOUS | Status: AC
  Filled 2016-04-08: qty 100

## 2016-04-08 MED ORDER — HYDROXYZINE HCL 25 MG PO TABS
25.0000 mg | ORAL_TABLET | Freq: Four times a day (QID) | ORAL | Status: DC | PRN
  Administered 2016-04-08: 25 mg via ORAL
  Filled 2016-04-08 (×2): qty 1

## 2016-04-08 MED ORDER — SODIUM CHLORIDE 0.9% FLUSH: 3.0000 mL | INTRAVENOUS | Status: DC | PRN

## 2016-04-08 MED ORDER — PHENYLEPHRINE 8 MG IN D5W 100 ML (0.08MG/ML) PREMIX OPTIME
INJECTION | INTRAVENOUS | Status: AC
  Filled 2016-04-08: qty 100

## 2016-04-08 MED ORDER — FERROUS SULFATE 325 (65 FE) MG PO TABS
325.0000 mg | ORAL_TABLET | Freq: Two times a day (BID) | ORAL | Status: DC
  Administered 2016-04-08 – 2016-04-10 (×4): 325 mg via ORAL
  Filled 2016-04-08 (×4): qty 1

## 2016-04-08 MED ORDER — ONDANSETRON HCL 4 MG/2ML IJ SOLN: 4.0000 mg | Freq: Three times a day (TID) | INTRAMUSCULAR | Status: DC | PRN

## 2016-04-08 MED ORDER — PHENYLEPHRINE 8 MG IN D5W 100 ML (0.08MG/ML) PREMIX OPTIME
INJECTION | INTRAVENOUS | Status: DC | PRN
  Administered 2016-04-08: 60 ug/min via INTRAVENOUS

## 2016-04-08 MED ORDER — KETOROLAC TROMETHAMINE 30 MG/ML IJ SOLN
30.0000 mg | Freq: Four times a day (QID) | INTRAMUSCULAR | Status: AC | PRN
  Filled 2016-04-08: qty 1

## 2016-04-08 MED ORDER — BUPIVACAINE HCL (PF) 0.5 % IJ SOLN
INTRAMUSCULAR | Status: AC
  Filled 2016-04-08: qty 30

## 2016-04-08 MED ORDER — NALOXONE HCL 2 MG/2ML IJ SOSY
1.0000 ug/kg/h | PREFILLED_SYRINGE | INTRAMUSCULAR | Status: DC | PRN
  Filled 2016-04-08: qty 2

## 2016-04-08 MED ORDER — FENTANYL CITRATE (PF) 100 MCG/2ML IJ SOLN
INTRAMUSCULAR | Status: AC
  Filled 2016-04-08: qty 2

## 2016-04-08 MED ORDER — PROMETHAZINE HCL 25 MG/ML IJ SOLN
6.2500 mg | INTRAMUSCULAR | Status: DC | PRN
  Administered 2016-04-08: 6.25 mg via INTRAVENOUS

## 2016-04-08 SURGICAL SUPPLY — 31 items
BENZOIN TINCTURE PRP APPL 2/3 (GAUZE/BANDAGES/DRESSINGS) ×3 IMPLANT
CHLORAPREP W/TINT 26ML (MISCELLANEOUS) ×3 IMPLANT
CLAMP CORD UMBIL (MISCELLANEOUS) ×3 IMPLANT
CLOSURE WOUND 1/2 X4 (GAUZE/BANDAGES/DRESSINGS) ×1
CLOTH BEACON ORANGE TIMEOUT ST (SAFETY) ×3 IMPLANT
DRSG OPSITE POSTOP 4X10 (GAUZE/BANDAGES/DRESSINGS) ×3 IMPLANT
ELECT REM PT RETURN 9FT ADLT (ELECTROSURGICAL) ×3
ELECTRODE REM PT RTRN 9FT ADLT (ELECTROSURGICAL) ×1 IMPLANT
GLOVE BIOGEL PI IND STRL 7.0 (GLOVE) ×3 IMPLANT
GLOVE BIOGEL PI INDICATOR 7.0 (GLOVE) ×6
GLOVE ECLIPSE 7.0 STRL STRAW (GLOVE) ×3 IMPLANT
GOWN STRL REUS W/TWL LRG LVL3 (GOWN DISPOSABLE) ×6 IMPLANT
NEEDLE HYPO 22GX1.5 SAFETY (NEEDLE) ×3 IMPLANT
NEEDLE HYPO 25X5/8 SAFETYGLIDE (NEEDLE) ×3 IMPLANT
NS IRRIG 1000ML POUR BTL (IV SOLUTION) ×3 IMPLANT
PACK C SECTION WH (CUSTOM PROCEDURE TRAY) ×3 IMPLANT
PAD ABD 7.5X8 STRL (GAUZE/BANDAGES/DRESSINGS) ×3 IMPLANT
PAD OB MATERNITY 4.3X12.25 (PERSONAL CARE ITEMS) ×3 IMPLANT
PENCIL SMOKE EVAC W/HOLSTER (ELECTROSURGICAL) ×3 IMPLANT
RTRCTR C-SECT PINK 25CM LRG (MISCELLANEOUS) ×3 IMPLANT
SPONGE GAUZE 4X4 12PLY (GAUZE/BANDAGES/DRESSINGS) ×3 IMPLANT
STRIP CLOSURE SKIN 1/2X4 (GAUZE/BANDAGES/DRESSINGS) ×2 IMPLANT
SUT PLAIN 2 0 XLH (SUTURE) IMPLANT
SUT VIC AB 0 CT1 36 (SUTURE) ×3 IMPLANT
SUT VIC AB 0 CTX 36 (SUTURE) ×4
SUT VIC AB 0 CTX36XBRD ANBCTRL (SUTURE) ×2 IMPLANT
SUT VIC AB 4-0 KS 27 (SUTURE) ×3 IMPLANT
SYR CONTROL 10ML LL (SYRINGE) ×3 IMPLANT
TAPE CLOTH SURG 4X10 WHT LF (GAUZE/BANDAGES/DRESSINGS) ×3 IMPLANT
TOWEL OR 17X24 6PK STRL BLUE (TOWEL DISPOSABLE) ×3 IMPLANT
TRAY FOLEY CATH SILVER 14FR (SET/KITS/TRAYS/PACK) ×3 IMPLANT

## 2016-04-08 NOTE — Op Note (Signed)
Melody Mcclure PROCEDURE DATE: 04/08/2016  PREOPERATIVE DIAGNOSES: Intrauterine pregnancy at 3674w0d weeks gestation; malpresentation: breech and PPROM  POSTOPERATIVE DIAGNOSES: The same  PROCEDURE: Primary Low Transverse Cesarean Section  SURGEON:  Dr. Jaynie CollinsUgonna Burdette Forehand  ASSISTANT:  Dr. Ernestina PennaNicholas Schenk  ANESTHESIOLOGIST: Dr. Arrie AranStephen Turk  INDICATIONS: Melody Mcclure is a 25 y.o. G1P0101 at 5674w0d here for cesarean section secondary to the indications listed under preoperative diagnoses; please see preoperative note for further details.  The risks of cesarean section were discussed with the patient including but were not limited to: bleeding which may require transfusion or reoperation; infection which may require antibiotics; injury to bowel, bladder, ureters or other surrounding organs; injury to the fetus; need for additional procedures including hysterectomy in the event of a life-threatening hemorrhage; placental abnormalities wth subsequent pregnancies, incisional problems, thromboembolic phenomenon and other postoperative/anesthesia complications.   The patient concurred with the proposed plan, giving informed written consent for the procedure.    FINDINGS:  Viable female infant in frank breech presentation.  Apgars and weight pending at time of this note, please see delivery record.  Scant amniotic fluid.  Intact placenta, three vessel cord.  Bicornuate uterus, fallopian tubes and ovaries bilaterally.  ANESTHESIA: Spinal INTRAVENOUS FLUIDS: 2400 ml ESTIMATED BLOOD LOSS: 500 ml URINE OUTPUT:  125 ml SPECIMENS: Placenta sent to pathology COMPLICATIONS: None immediate  PROCEDURE IN DETAIL:  The patient preoperatively received intravenous antibiotics and had sequential compression devices applied to her lower extremities.  She was then taken to the operating room where spinal anesthesia was administered and was found to be adequate. She was then placed in a dorsal supine position with a leftward  tilt, and prepped and draped in a sterile manner.  A foley catheter was placed into her bladder and attached to constant gravity.  After an adequate timeout was performed, a Pfannenstiel skin incision was made with scalpel and carried through to the underlying layer of fascia. The fascia was incised in the midline, and this incision was extended bilaterally using the Mayo scissors.  Kocher clamps were applied to the superior aspect of the fascial incision and the underlying rectus muscles were dissected off bluntly.  A similar process was carried out on the inferior aspect of the fascial incision. The rectus muscles were separated in the midline bluntly and the peritoneum was entered bluntly. Attention was turned to the lower uterine segment where a low transverse hysterotomy was made with a scalpel and extended bilaterally bluntly.  The infant was successfully delivered, the cord was clamped and cut after one minute, and the infant was handed over to the awaiting neonatology team. Uterine massage was then administered, and the placenta delivered intact with a three-vessel cord. The uterus was then cleared of clots and debris.  The hysterotomy was closed with 0 Vicryl in a running locked fashion, and an imbricating layer was also placed with 0 Vicryl.  Figure-of-eight 0 Vicryl serosal stitches were placed to help with hemostasis.  The pelvis was cleared of all clot and debris. Hemostasis was confirmed on all surfaces.  The peritoneum was closed with a 0 Vicryl running stitch. The fascia was then closed using 0 Vicryl in a running fashion.  The subcutaneous layer was irrigated, and 30 ml of 0.5% Marcaine was injected subcutaneously around the incision.  The skin was closed with a 4-0 Vicryl subcuticular stitch. The patient tolerated the procedure well. Sponge, lap, instrument and needle counts were correct x 3.  She was taken to the recovery room in stable  condition.    Jaynie CollinsUGONNA  [name], MD, FACOG Attending  Obstetrician & Gynecologist Faculty Practice, [facility]

## 2016-04-08 NOTE — Progress Notes (Signed)
ACULTY PRACTICE ANTEPARTUM COMPREHENSIVE PROGRESS NOTE  Melody Mcclure is a 25 y.o. G1P0 at [redacted]w[redacted]d who is admitted for PPROM.    Fetal presentation is breech. Length of Stay:  7  Days  Subjective: Patient is without complaints this morning. She reports good fetal movement. Continues to leak some fluid at times. Denies uterine contractions.  Will undergo cesarean section.   Vitals:  Blood pressure 120/76, pulse (!) 114, temperature 98.5 F (36.9 C), temperature source Oral, resp. rate 20, height 5' 7" (1.702 m), weight 143 lb 12.8 oz (65.2 kg), last menstrual period 07/24/2015, SpO2 97 %. Physical Examination: BP 120/76 (BP Location: Right Arm)   Pulse (!) 114   Temp 98.5 F (36.9 C) (Oral)   Resp 20   Ht 5' 7" (1.702 m)   Wt 143 lb 12.8 oz (65.2 kg)   LMP 07/24/2015 (Approximate)   SpO2 97%   BMI 22.52 kg/m  Gen NAD Lungs Clear  Heart RRR  Abd soft + BS gravid non tender Ext non tender  Fetal Monitoring:   Fetal Heart Rate A  Mode External filed at 04/08/2016 0627  Baseline Rate (A) 120 bpm filed at 04/08/2016 0627  Variability 6-25 BPM filed at 04/08/2016 0627  Accelerations 15 x 15 filed at 04/08/2016 0627  Decelerations None filed at 04/08/2016 0627   Labs:  Results for orders placed or performed during the hospital encounter of 04/01/16 (from the past 24 hour(s))  Type and screen [facility]   Collection Time: 04/07/16  5:19 PM  Result Value Ref Range   ABO/RH(D) O POS    Antibody Screen NEG    Sample Expiration 04/10/2016   CBC   Collection Time: 04/08/16  7:50 AM  Result Value Ref Range   WBC 8.3 4.0 - 10.5 K/uL   RBC 4.91 3.87 - 5.11 MIL/uL   Hemoglobin 11.7 (L) 12.0 - 15.0 g/dL   HCT 35.3 (L) 36.0 - 46.0 %   MCV 71.9 (L) 78.0 - 100.0 fL   MCH 23.8 (L) 26.0 - 34.0 pg   MCHC 33.1 30.0 - 36.0 g/dL   RDW 15.8 (H) 11.5 - 15.5 %   Platelets 209 150 - 400 K/uL    Imaging Studies:       Medications:  Scheduled . amoxicillin  500 mg Oral  Q8H  . docusate sodium  100 mg Oral Daily  . ferrous gluconate  324 mg Oral BID WC  . prenatal multivitamin  1 tablet Oral Q1200  . sodium chloride flush  3 mL Intravenous Q12H  . sodium citrate-citric acid  30 mL Oral Once   I have reviewed the patient's current medications.  ASSESSMENT: Patient Active Problem List   Diagnosis Date Noted  . Anemia affecting pregnancy in third trimester 04/02/2016  . Preterm premature rupture of membranes (PPROM) with unknown onset of labor 04/01/2016  . Short cervix, antepartum 01/29/2016  . Supervision of high risk pregnancy, antepartum 10/23/2015  . 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  PLAN: The risks of cesarean section discussed with the patient included but were not limited to: bleeding which may require transfusion or reoperation; infection which may require antibiotics; injury to bowel, bladder, ureters or other surrounding organs; injury to the fetus; need for additional procedures including hysterectomy in the event of a life-threatening hemorrhage; placental abnormalities wth subsequent pregnancies, incisional problems, thromboembolic phenomenon and other postoperative/anesthesia complications. The patient concurred with the proposed plan, giving informed written consent for the   procedure.   Patient has been NPO since midnight she will remain NPO for procedure. Anesthesia and OR aware. Preoperative prophylactic antibiotics and SCDs ordered on call to the OR.  To OR when ready.  NICU will be present at delivery.   Continue routine antenatal care.   Sury Wentworth A 04/08/2016,9:46 AM

## 2016-04-08 NOTE — Progress Notes (Signed)
Dr. Macon LargeAnyanwu notified rergarding pt c/o itching despite pt receiving Benadryl.  New order received.

## 2016-04-08 NOTE — Progress Notes (Signed)
Pt to OR via stretcher.

## 2016-04-08 NOTE — Anesthesia Postprocedure Evaluation (Signed)
Anesthesia Post Note  Patient: Warehouse managerAlesha Meckel  Procedure(s) Performed: Procedure(s) (LRB): CESAREAN SECTION (N/A)  Patient location during evaluation: PACU Anesthesia Type: Spinal Level of consciousness: oriented and awake and alert Pain management: pain level controlled Vital Signs Assessment: post-procedure vital signs reviewed and stable Respiratory status: spontaneous breathing, respiratory function stable and patient connected to nasal cannula oxygen Cardiovascular status: blood pressure returned to baseline and stable Postop Assessment: no headache, no backache, spinal receding, patient able to bend at knees and no signs of nausea or vomiting Anesthetic complications: no     Last Vitals:  Vitals:   04/08/16 1625 04/08/16 1640  BP: (!) 142/89 (!) 145/77  Pulse: (!) 129 (!) 136  Resp:    Temp:      Last Pain:  Vitals:   04/08/16 1624  TempSrc: Oral  PainSc:    Pain Goal: Patients Stated Pain Goal: 0 (04/07/16 2025)               Cecile HearingStephen Edward Hernando Reali

## 2016-04-08 NOTE — H&P (View-Only) (Signed)
ACULTY PRACTICE ANTEPARTUM COMPREHENSIVE PROGRESS NOTE  Dineen Kidlesha Kissinger is a 25 y.o. G1P0 at 3295w0d who is admitted for PPROM.    Fetal presentation is breech. Length of Stay:  7  Days  Subjective: Patient is without complaints this morning. She reports good fetal movement. Continues to leak some fluid at times. Denies uterine contractions.  Will undergo cesarean section.   Vitals:  Blood pressure 120/76, pulse (!) 114, temperature 98.5 F (36.9 C), temperature source Oral, resp. rate 20, height 5\' 7"  (1.702 m), weight 143 lb 12.8 oz (65.2 kg), last menstrual period 07/24/2015, SpO2 97 %. Physical Examination: BP 120/76 (BP Location: Right Arm)   Pulse (!) 114   Temp 98.5 F (36.9 C) (Oral)   Resp 20   Ht 5\' 7"  (1.702 m)   Wt 143 lb 12.8 oz (65.2 kg)   LMP 07/24/2015 (Approximate)   SpO2 97%   BMI 22.52 kg/m  Gen NAD Lungs Clear  Heart RRR  Abd soft + BS gravid non tender Ext non tender  Fetal Monitoring:   Fetal Heart Rate A  Mode External filed at 04/08/2016 [id number]  Baseline Rate (A) 120 bpm filed at 04/08/2016 [id number]  Variability 6-25 BPM filed at 04/08/2016 [id number]  Accelerations 15 x 15 filed at 04/08/2016 [id number]  Decelerations None filed at 04/08/2016 [id number]   Labs:  Results for orders placed or performed during the hospital encounter of 04/01/16 (from the past 24 hour(s))  [facility]   Collection Time: 04/07/16  5:19 PM  Result Value Ref Range   ABO/RH(D) O POS    Antibody Screen NEG    Sample Expiration 04/10/2016   CBC   Collection Time: 04/08/16  7:50 AM  Result Value Ref Range   WBC 8.3 4.0 - 10.5 K/uL   RBC 4.91 3.87 - 5.11 MIL/uL   Hemoglobin 11.7 (L) 12.0 - 15.0 g/dL   HCT 62.935.3 (L) 52.836.0 - 41.346.0 %   MCV 71.9 (L) 78.0 - 100.0 fL   MCH 23.8 (L) 26.0 - 34.0 pg   MCHC 33.1 30.0 - 36.0 g/dL   RDW 24.415.8 (H) 01.011.5 - 27.215.5 %   Platelets 209 150 - 400 K/uL    Imaging Studies:       Medications:  Scheduled . amoxicillin  500 mg Oral  Q8H  . docusate sodium  100 mg Oral Daily  . ferrous gluconate  324 mg Oral BID WC  . prenatal multivitamin  1 tablet Oral Q1200  . sodium chloride flush  3 mL Intravenous Q12H  . sodium citrate-citric acid  30 mL Oral Once   I have reviewed the patient's current medications.  ASSESSMENT: Patient Active Problem List   Diagnosis Date Noted  . Anemia affecting pregnancy in third trimester 04/02/2016  . Preterm premature rupture of membranes (PPROM) with unknown onset of labor 04/01/2016  . Short cervix, antepartum 01/29/2016  . Supervision of high risk pregnancy, antepartum 10/23/2015  . 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  PLAN: The risks of cesarean section discussed with the patient included but were not limited to: bleeding which may require transfusion or reoperation; infection which may require antibiotics; injury to bowel, bladder, ureters or other surrounding organs; injury to the fetus; need for additional procedures including hysterectomy in the event of a life-threatening hemorrhage; placental abnormalities wth subsequent pregnancies, incisional problems, thromboembolic phenomenon and other postoperative/anesthesia complications. The patient concurred with the proposed plan, giving informed written consent for the  procedure.   Patient has been NPO since midnight she will remain NPO for procedure. Anesthesia and OR aware. Preoperative prophylactic antibiotics and SCDs ordered on call to the OR.  To OR when ready.  NICU will be present at delivery.   Continue routine antenatal care.   ANYANWU,UGONNA A 04/08/2016,9:46 AM

## 2016-04-08 NOTE — Progress Notes (Signed)
Pt to NICU to visit infant.  Accompanied off unit w/ family.

## 2016-04-08 NOTE — Lactation Note (Signed)
This note was copied from a baby's chart. Lactation Consultation Note  Patient Name: Melody Mcclure QMVHQ'IToday's Date: 04/08/2016 Reason for consult: Initial assessment;NICU baby;Infant < 6lbs;Late preterm infant   Initial consult with first time mom in PACU. Infant born at 34 weeks weighing 4 lb 1 oz. Infant was transferred to NICU after birth. Mom with PROM.   Mom was sleepy and feeling nauseated. She was agreeable to allowing me to hand express to obtain colostrum for infant. Mom with small compressible breast with everted nipples. She reports positive breast changes with pregnancy. Was able to hand express 2 cc colostrum, MGM took colostrum to NICU.   [facility] and was not planning to apply. Mom does not have a pump at home, [facility] since infant in [facility]ICU.   Providing Milk for Your Baby in NICU, BF Resources Handout and LC Brochure left at bedside. Reviewed with mom and GM the importance of pumping 8-12 x in 24 hours to stimulate milk supply. Will need follow up to be taught to hand express, pump and review pumping and milk storage for NICU infant.     Maternal Data Formula Feeding for Exclusion: No Has patient been taught Hand Expression?: No (Mom sleepy and feeling nauseated, need to reteach) Does the patient have breastfeeding experience prior to this delivery?: No  Feeding    LATCH Score/Interventions                      Lactation Tools Discussed/Used WIC Program: No   Consult Status Consult Status: Follow-up Date: 04/08/16 Follow-up type: In-patient    Silas FloodSharon S Lleyton Byers 04/08/2016, 12:21 PM

## 2016-04-08 NOTE — Anesthesia Preprocedure Evaluation (Addendum)
Anesthesia Evaluation  Patient identified by MRN, date of birth, ID band Patient awake    Reviewed: Allergy & Precautions, NPO status , Patient's Chart, lab work & pertinent test results  Airway Mallampati: II  TM Distance: >3 FB Neck ROM: Full    Dental  (+) Teeth Intact, Dental Advisory Given, Chipped,    Pulmonary neg pulmonary ROS,    Pulmonary exam normal breath sounds clear to auscultation       Cardiovascular Exercise Tolerance: Good negative cardio ROS Normal cardiovascular exam Rhythm:Regular Rate:Normal     Neuro/Psych negative neurological ROS  negative psych ROS   GI/Hepatic negative GI ROS, Neg liver ROS,   Endo/Other  negative endocrine ROS  Renal/GU negative Renal ROS     Musculoskeletal negative musculoskeletal ROS (+)   Abdominal   Peds  Hematology  (+) Blood dyscrasia, anemia , Plt 209k   Anesthesia Other Findings Day of surgery medications reviewed with the patient.  Reproductive/Obstetrics (+) Pregnancy PPROM @34wks                            Anesthesia Physical Anesthesia Plan  ASA: II  Anesthesia Plan: Spinal   Post-op Pain Management:    Induction:   Airway Management Planned:   Additional Equipment:   Intra-op Plan:   Post-operative Plan:   Informed Consent: I have reviewed the patients History and Physical, chart, labs and discussed the procedure including the risks, benefits and alternatives for the proposed anesthesia with the patient or authorized representative who has indicated his/her understanding and acceptance.   Dental advisory given  Plan Discussed with: CRNA, Anesthesiologist and Surgeon  Anesthesia Plan Comments: (Discussed risks and benefits of and differences between spinal and general. Discussed risks of spinal including headache, backache, failure, bleeding, infection, and nerve damage. Patient consents to spinal. Questions answered.  Coagulation studies and platelet count acceptable.)        Anesthesia Quick Evaluation

## 2016-04-08 NOTE — Anesthesia Procedure Notes (Signed)
Spinal  Patient location during procedure: OR Start time: 04/08/2016 10:29 AM End time: 04/08/2016 10:30 AM Staffing Anesthesiologist: Cecile HearingURK, Ayrabella Labombard EDWARD Performed: anesthesiologist  Preanesthetic Checklist Completed: patient identified, surgical consent, pre-op evaluation, timeout performed, IV checked, risks and benefits discussed and monitors and equipment checked Spinal Block Patient position: sitting Prep: site prepped and draped and DuraPrep Patient monitoring: continuous pulse ox and blood pressure Approach: midline Location: L3-4 Injection technique: single-shot Needle Needle type: Pencan  Needle gauge: 25 G Needle length: 9 cm Assessment Sensory level: T4 Additional Notes Functioning IV was confirmed and monitors were applied. Sterile prep and drape, including hand hygiene, mask and sterile gloves were used. The patient was positioned and the spine was prepped. The skin was anesthetized with lidocaine.  Free flow of clear CSF was obtained prior to injecting local anesthetic into the CSF.  The spinal needle aspirated freely following injection.  The needle was carefully withdrawn.  The patient tolerated the procedure well. Consent was obtained prior to procedure with all questions answered and concerns addressed. Risks including but not limited to bleeding, infection, nerve damage, paralysis, failed block, inadequate analgesia, allergic reaction, high spinal, itching and headache were discussed and the patient wished to proceed.   Arrie AranStephen Stephanine Reas, MD

## 2016-04-08 NOTE — Transfer of Care (Signed)
Immediate Anesthesia Transfer of Care Note  Patient: Melody Mcclure  Procedure(s) Performed: Procedure(s): CESAREAN SECTION (N/A)  Patient Location: PACU  Anesthesia Type:Spinal  Level of Consciousness: awake, alert  and oriented  Airway & Oxygen Therapy: Patient Spontanous Breathing  Post-op Assessment: Report given to RN and Post -op Vital signs reviewed and stable  Post vital signs: Reviewed and stable  Last Vitals:  Vitals:   04/08/16 0627 04/08/16 0739  BP:  120/76  Pulse:  (!) 114  Resp: 16 20  Temp:  36.9 C    Last Pain:  Vitals:   04/08/16 0739  TempSrc: Oral  PainSc: 0-No pain      Patients Stated Pain Goal: 0 (04/07/16 2025)  Complications: No apparent anesthesia complications

## 2016-04-08 NOTE — Lactation Note (Signed)
This note was copied from a baby's chart. Lactation Consultation Note  Patient Name: Boy Dineen Kidlesha Puls OZHYQ'MToday's Date: 04/08/2016 Reason for consult: Follow-up assessment Mom very drowsy but willing to start pumping.  Symphony pump set up and initiated.  Flanges changed to 21 mm for better fit.  I explained that she may need to increase to a 24 mm as breasts become full.  Taught mom hand expression and 2 drops obtained.  Mom unable to sit upright due to abdominal discomfort.  Instructed mom to sit upright when possible while pumping so the milk can be collected better. ,.   Teaching will need to be reinforced because of maternal drowsiness.  Instructed to pump 8-12 times/24 hours.  Encouraged to call with concerns/assist.  Maternal Data Formula Feeding for Exclusion: No Has patient been taught Hand Expression?: No (Mom sleepy and feeling nauseated, need to reteach) Does the patient have breastfeeding experience prior to this delivery?: No  Feeding    LATCH Score/Interventions                      Lactation Tools Discussed/Used WIC Program: No Pump Review: Setup, frequency, and cleaning;Milk Storage Initiated by:: LC Date initiated:: 04/08/16   Consult Status Consult Status: Follow-up Date: 04/08/16 Follow-up type: In-patient    Huston FoleyMOULDEN, Lottie Siska S 04/08/2016, 3:19 PM

## 2016-04-08 NOTE — Interval H&P Note (Signed)
History and Physical Interval Note   Melody Mcclure  has presented today for surgery, with the diagnosis of PPROM, Breech presentation.  The various methods of treatment have been discussed with the patient and family. After consideration of risks, benefits and other options for treatment, the patient has consented to  Procedure(s): CESAREAN SECTION (N/A) as a surgical intervention .  The patient's history has been reviewed, patient examined, no change in status, stable for surgery.  I have reviewed the patient's chart and labs.  Questions were answered to the patient's satisfaction.     Tereso NewcomerUgonna A Jaidyn Usery, MD

## 2016-04-09 ENCOUNTER — Encounter: Payer: Self-pay | Admitting: Obstetrics and Gynecology

## 2016-04-09 LAB — CBC
HEMATOCRIT: 33.4 % — AB (ref 36.0–46.0)
Hemoglobin: 10.9 g/dL — ABNORMAL LOW (ref 12.0–15.0)
MCH: 23.6 pg — ABNORMAL LOW (ref 26.0–34.0)
MCHC: 32.6 g/dL (ref 30.0–36.0)
MCV: 72.5 fL — AB (ref 78.0–100.0)
PLATELETS: 195 10*3/uL (ref 150–400)
RBC: 4.61 MIL/uL (ref 3.87–5.11)
RDW: 15.7 % — AB (ref 11.5–15.5)
WBC: 11.6 10*3/uL — ABNORMAL HIGH (ref 4.0–10.5)

## 2016-04-09 NOTE — Progress Notes (Signed)
Subjective: Postpartum Day 1: Cesarean Delivery  for PPROM and breech presentation Patient reports tolerating PO and no problems voiding.  No flatus yet. Minimal pain.  Objective: Vital signs in last 24 hours: Temp:  [93 F (33.9 C)-98.8 F (37.1 C)] 98.3 F (36.8 C) (11/30 0523) Pulse Rate:  [63-136] 89 (11/30 0523) Resp:  [12-20] 18 (11/30 0523) BP: (101-145)/(60-89) 120/75 (11/30 0523) SpO2:  [97 %-100 %] 100 % (11/30 0523) Weight:  [143 lb 12.8 oz (65.2 kg)] 143 lb 12.8 oz (65.2 kg) (11/29 0827)  Physical Exam:  General: alert and no distress  Lungs: CTAB Heart: RRR Lochia: appropriate Uterine Fundus: firm Incision: healing well, no dehiscence, no significant erythema, old drainage on dressing DVT Evaluation: No evidence of DVT seen on physical exam. Negative Homan's sign.   Recent Labs  04/08/16 0750 04/09/16 0529  HGB 11.7* 10.9*  HCT 35.3* 33.4*    Assessment/Plan: Status post Cesarean section. Doing well postoperatively.  Breastfeeding Mirena IUD vs POPs for contraception Continue current care.  Tereso NewcomerANYANWU,Cledith Kamiya A, MD 04/09/2016, 7:49 AM

## 2016-04-09 NOTE — Anesthesia Postprocedure Evaluation (Signed)
Anesthesia Post Note  Patient: Warehouse managerAlesha Mcclure  Procedure(s) Performed: Procedure(s) (LRB): CESAREAN SECTION (N/A)  Patient location during evaluation: Antenatal Anesthesia Type: Spinal Level of consciousness: awake and alert, oriented and patient cooperative Pain management: pain level controlled Vital Signs Assessment: post-procedure vital signs reviewed and stable Respiratory status: spontaneous breathing Cardiovascular status: stable Postop Assessment: no headache, patient able to bend at knees, no signs of nausea or vomiting and spinal receding Anesthetic complications: no Comments: Pain score 3.     Last Vitals:  Vitals:   04/09/16 0523 04/09/16 0756  BP: 120/75 116/66  Pulse: 89 90  Resp: 18 16  Temp: 36.8 C 36.8 C    Last Pain:  Vitals:   04/09/16 0900  TempSrc:   PainSc: 0-No pain   Pain Goal: Patients Stated Pain Goal: 2 (04/08/16 2030)               Merrilyn PumaWRINKLE,[name]

## 2016-04-09 NOTE — Addendum Note (Signed)
Addendum  created 04/09/16 1018 by Angela Adamana G Coty Larsh, CRNA   Sign clinical note

## 2016-04-09 NOTE — Lactation Note (Signed)
This note was copied from a baby's chart. Lactation Consultation Note  Patient Name: Melody Mcclure ZOXWR'UToday's Date: 04/09/2016 Reason for consult: Follow-up assessment;NICU baby  NICU baby 7628 hours old. Mom reports that she has been pumping but is not getting much colostrum. Discussed progression of milk coming to volume and enc mom to continue to pump every 2-3 hours for a total of 8 times/24 hours followed by hand expression. Mom states that she is not active with WIC, so given paperwork for 2-week rental. Mom reports that she had held the baby STS.  Maternal Data    Feeding Feeding Type: Donor Breast Milk  LATCH Score/Interventions                      Lactation Tools Discussed/Used Tools: Pump Breast pump type: Double-Electric Breast Pump   Consult Status Consult Status: Follow-up Date: 04/10/16 Follow-up type: In-patient    Melody Mcclure 04/09/2016, 3:43 PM

## 2016-04-10 ENCOUNTER — Ambulatory Visit: Payer: Self-pay

## 2016-04-10 ENCOUNTER — Encounter (HOSPITAL_COMMUNITY): Payer: Self-pay

## 2016-04-10 ENCOUNTER — Encounter (HOSPITAL_COMMUNITY): Payer: Self-pay | Admitting: Obstetrics & Gynecology

## 2016-04-10 ENCOUNTER — Ambulatory Visit (HOSPITAL_COMMUNITY)
Admission: RE | Admit: 2016-04-10 | Discharge: 2016-04-10 | Disposition: A | Discharge status: Home / Self Care | Payer: Medicaid Other | Source: Ambulatory Visit | Attending: Women's Health | Admitting: Women's Health

## 2016-04-10 MED ORDER — OXYCODONE-ACETAMINOPHEN 5-325 MG PO TABS: 1.0000 | ORAL_TABLET | ORAL | 0 refills | Status: DC | PRN

## 2016-04-10 MED ORDER — FERROUS SULFATE 325 (65 FE) MG PO TABS: 325.0000 mg | ORAL_TABLET | Freq: Two times a day (BID) | ORAL | 3 refills | Status: DC

## 2016-04-10 MED ORDER — IBUPROFEN 600 MG PO TABS: 600.0000 mg | ORAL_TABLET | Freq: Four times a day (QID) | ORAL | 0 refills | Status: DC

## 2016-04-10 NOTE — Lactation Note (Signed)
This note was copied from a baby's chart. Lactation Consultation Note  Patient Name: Melody Mcclure Today's Date: 04/10/2016 Reason for consult: Follow-up assessment;NICU baby  NICU baby 50 hours old. Met with mom just prior to D/C. Mom states that she is going to pick up a DEBP when she leaves the hospital. Mom declined DEBP rental, but is aware of the benefits of hospital-grade pump during the first 2 weeks of lactation. Enc mom to use DEBP in pumping rooms in NICU, and mom knows that she can still rent a pump if she changes her mind. Mom aware of OP/BFSG and LC phone line assistance after D/C.    Maternal Data    Feeding Feeding Type: Donor Breast Milk Nipple Type: Slow - flow Length of feed: 30 min  LATCH Score/Interventions                      Lactation Tools Discussed/Used     Consult Status Consult Status: PRN     D  04/10/2016, 2:13 PM    

## 2016-04-10 NOTE — Discharge Instructions (Signed)

## 2016-04-10 NOTE — Progress Notes (Signed)
Discharge instructions reviewed with patient. Patient verbalized an understanding of discharge instructions.  

## 2016-04-10 NOTE — Discharge Summary (Signed)
OB Discharge Summary     Patient Name: Melody Mcclure DOB: [birth year] MRN: [id number]  Date of admission: 04/01/2016 Delivering MD: Jaynie CollinsANYANWU, UGONNA A   Date of discharge: 04/10/2016  Admitting diagnosis: 33WKS MUCOUS PLUG LOST, WATER BROKE Primary Cesearan Section for Breech presentation, PPROM <34weeks  Intrauterine pregnancy: 8161w0d     Secondary diagnosis:  Active Problems:   Preterm premature rupture of membranes (PPROM) with unknown onset of labor   Anemia affecting pregnancy in third trimester   S/P cesarean section   Discharge diagnosis: Preterm Pregnancy Delivered                                   Complications: None  Hospital course:  Sceduled C/S   25 y.o. yo G1P0101 at 2761w0d was admitted to the hospital 04/01/2016 with PPROM .  At 34 weeks she was scheduled cesarean section with the following indication:Malpresentation and PPROM.  Membrane Rupture Time/Date: 1:00 PM ,04/01/2016   Patient delivered a Viable infant.04/08/2016  Details of operation can be found in separate operative note.  Pateint had an uncomplicated postpartum course.  She is ambulating, tolerating a regular diet, passing flatus, and urinating well.  She is requesting discharge to home today. Patient is discharged home in stable condition on  04/10/16          Physical exam Vitals:   04/09/16 1615 04/09/16 1936 04/10/16 0547 04/10/16 0746  BP: 105/72 123/79 109/68 120/78  Pulse: 99 97 73 (!) 116  Resp: 16 17 16 16   Temp: 98.2 F (36.8 C) 98 F (36.7 C) 98.3 F (36.8 C) 98.5 F (36.9 C)  TempSrc: Oral Oral Oral Oral  SpO2:  100% 100%   Weight:      Height:       General: alert, cooperative and no distress Lochia: appropriate Uterine Fundus: firm Incision: Healing well with no significant drainage, Dressing is clean, dry, and intact DVT Evaluation: No evidence of DVT seen on physical exam. Labs: Lab Results  Component Value Date   WBC 11.6 (H) 04/09/2016   HGB 10.9 (L) 04/09/2016   HCT  33.4 (L) 04/09/2016   MCV 72.5 (L) 04/09/2016   PLT 195 04/09/2016   CMP Latest Ref Rng & Units 03/18/2016  Glucose 65 - 99 mg/dL 83  BUN 6 - 20 mg/dL 8  Creatinine 7.820.44 - 9.561.00 mg/dL 2.130.70  Sodium 086135 - 578145 mmol/L 134(L)  Potassium 3.5 - 5.1 mmol/L 3.9  Chloride 101 - 111 mmol/L 103  CO2 22 - 32 mmol/L 23  Calcium 8.9 - 10.3 mg/dL 9.2  Total Protein 6.5 - 8.1 g/dL -  Total Bilirubin 0.3 - 1.2 mg/dL -  Alkaline Phos 38 - 469126 U/L -  AST 15 - 41 U/L -  ALT 14 - 54 U/L -    Discharge instruction: per After Visit Summary and "Baby and Me Booklet".  After visit meds:    Medication List    STOP taking these medications   cyclobenzaprine 10 MG tablet Commonly known as:  FLEXERIL   nitrofurantoin (macrocrystal-monohydrate) 100 MG capsule Commonly known as:  MACROBID     TAKE these medications   ferrous sulfate 325 (65 FE) MG tablet Take 1 tablet (325 mg total) by mouth 2 (two) times daily with a meal.   ibuprofen 600 MG tablet Commonly known as:  ADVIL,MOTRIN Take 1 tablet (600 mg total) by mouth every 6 (six) hours.  oxyCODONE-acetaminophen 5-325 MG tablet Commonly known as:  PERCOCET/ROXICET Take 1 tablet by mouth every 4 (four) hours as needed (pain scale 4-7).   prenatal multivitamin Tabs tablet Take 1 tablet by mouth at bedtime.       Diet: routine diet  Activity: Advance as tolerated. Pelvic rest for 6 weeks.   Outpatient follow up:6 weeks Follow up Appt:Future Appointments Date Time Provider Department Center  08/28/2016 9:30 AM Harrington ChallengerNancy J Young, NP [facility]   Follow up Visit:No Follow-up on file.  Postpartum contraception: IUD Mirena  Newborn Data: Live born female  Birth Weight: 4 lb 1.3 oz (1850 g) APGAR: 9, 9  Baby Feeding: Breast Disposition:NICU   04/10/2016 Melody RosenthalHARRAWAY-SMITH, Debora Stockdale, MD

## 2016-04-15 ENCOUNTER — Ambulatory Visit: Payer: Self-pay

## 2016-04-15 NOTE — Lactation Note (Signed)
This note was copied from a baby's chart. Lactation Consultation Note  Patient Name: Boy Dineen Kidlesha Badami RUEAV'WToday's Date: 04/15/2016  Follow up visit made.  Mom is hold baby skin to skin.  She states baby recently latched and nursed for 20 minutes.  Mom is pumping every 3 hours and obtaining 75 mls from left and 30 mls from right.  Recommended she allow baby to breastfeed more on right side to stimulate supply.  Encouraged to call with concerns/assist.   Maternal Data    Feeding Feeding Type: Donor Breast Milk Length of feed: 30 min  LATCH Score/Interventions                      Lactation Tools Discussed/Used     Consult Status      Huston FoleyMOULDEN, Jeroline Wolbert S 04/15/2016, 2:48 PM

## 2016-04-23 ENCOUNTER — Ambulatory Visit: Payer: Self-pay

## 2016-04-23 ENCOUNTER — Encounter: Payer: Self-pay | Admitting: Medical

## 2016-04-23 NOTE — Lactation Note (Signed)
This note was copied from a baby's chart. Lactation Consultation Note; Nurse called and states that patient is going home and would like to see Lactation . Mother had questions regarding pumping. Mother states that she is pumping just enough to feed infant. She states she is pumping every 2-3 hours. Mother has a double Even Flow pump. Discussed to possiblity of renting a pump.  Mother advised to power pump once a day . Discussed eating oatmeal and taking a supplement such as fenugreek. Mother receptive to all teaching. Mother is aware of LC services.   Patient Name: Melody Mcclure ZOXWR'UToday's Date: 04/23/2016     Maternal Data    Feeding    LATCH Score/Interventions                      Lactation Tools Discussed/Used     Consult Status      Stevan BornKendrick, Simmie Camerer McCoy 04/23/2016, 10:09 AM

## 2016-05-08 ENCOUNTER — Ambulatory Visit (HOSPITAL_COMMUNITY)
Admission: EM | Admit: 2016-05-08 | Discharge: 2016-05-08 | Disposition: A | Discharge status: Home / Self Care | Payer: Medicaid Other | Attending: Psychiatry | Admitting: Psychiatry

## 2016-05-08 NOTE — H&P (Signed)
Behavioral Health Medical Screening Exam  Melody Mcclure is a 25 y.o. female [facility] due to reports of depression and anxiety post-partum in nature. Patient is requesting outpatient resources. Patient denies suicidal ideations, homicidal ideations, audiovisual hallucinations. Patient lives with her sister and reports that she is able to contract for safety for herself and child. Reports that the child is currently with her sister and that her parents have agreed to keep the child for today. TTS was able to arrange a therapy appointment for today at 1 pm and patient has agreed to meet with the therapist.   Total Time spent with patient: 15 minutes  Psychiatric Specialty Exam: Physical Exam  Constitutional: She is oriented to person, place, and time. She appears well-developed and well-nourished. No distress.  HENT:  Head: Normocephalic and atraumatic.  Right Ear: External ear normal.  Left Ear: External ear normal.  Eyes: Conjunctivae are normal. Right eye exhibits no discharge. Left eye exhibits no discharge. No scleral icterus.  Cardiovascular: Normal rate, regular rhythm and normal heart sounds.   Respiratory: Effort normal and breath sounds normal. No respiratory distress.  Musculoskeletal: Normal range of motion.  Neurological: She is alert and oriented to person, place, and time.  Skin: Skin is warm and dry. She is not diaphoretic.  Psychiatric: Her speech is normal and behavior is normal. Her mood appears anxious. Her affect is not blunt, not labile and not inappropriate. Thought content is not paranoid and not delusional. Cognition and memory are normal. She does not express impulsivity or inappropriate judgment. She exhibits a depressed mood. She expresses no homicidal and no suicidal ideation.    Review of Systems  Psychiatric/Behavioral: Positive for depression. Negative for hallucinations, memory loss, substance abuse and suicidal ideas. The patient is  nervous/anxious and has insomnia.   All other systems reviewed and are negative.   Blood pressure 116/83, pulse 66, temperature 98.4 F (36.9 C), resp. rate 18, SpO2 100 %, unknown if currently breastfeeding.There is no height or weight on file to calculate BMI.  General Appearance: Well Groomed  Eye Contact:  Fair  Speech:  Clear and Coherent  Volume:  Normal  Mood:  Anxious, Depressed, Hopeless and Worthless  Affect:  Congruent and Depressed  Thought Process:  Coherent and Goal Directed  Orientation:  Full (Time, Place, and Person)  Thought Content:  WDL and Hallucinations: None  Suicidal Thoughts:  No  Homicidal Thoughts:  No  Memory:  Immediate;   Good Recent;   Good Remote;   Good  Judgement:  Good  Insight:  Fair  Psychomotor Activity:  Normal  Concentration: Concentration: Good and Attention Span: Good  Recall:  Good  Fund of Knowledge:Good  Language: Good  Akathisia:  NA  Handed:  Right  AIMS (if indicated):     Assets:  Communication Skills Desire for Improvement Financial Resources/Insurance Housing Physical Health Transportation  Sleep:       Musculoskeletal: Strength & Muscle Tone: within normal limits Gait & Station: normal Patient leans: N/A  Blood pressure 116/83, pulse 66, temperature 98.4 F (36.9 C), resp. rate 18, SpO2 100 %, unknown if currently breastfeeding.  Recommendations:  Based on my evaluation the patient does not appear to have an emergency medical condition.  Jackelyn PolingJason A Charniece Venturino, NP 05/08/2016, 6:18 AM
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
## 2016-05-20 ENCOUNTER — Ambulatory Visit (INDEPENDENT_AMBULATORY_CARE_PROVIDER_SITE_OTHER): Payer: Medicaid Other | Admitting: Family Medicine

## 2016-05-20 ENCOUNTER — Ambulatory Visit: Payer: Self-pay | Admitting: Student

## 2016-05-20 ENCOUNTER — Encounter: Payer: Self-pay | Admitting: General Practice

## 2016-05-20 ENCOUNTER — Ambulatory Visit (INDEPENDENT_AMBULATORY_CARE_PROVIDER_SITE_OTHER): Payer: Medicaid Other | Admitting: Clinical

## 2016-05-20 VITALS — BP 110/91 | HR 93 | Ht 67.0 in | Wt 139.0 lb

## 2016-05-20 DIAGNOSIS — F4322 Adjustment disorder with anxiety: Secondary | ICD-10-CM

## 2016-05-20 DIAGNOSIS — Z30015 Encounter for initial prescription of vaginal ring hormonal contraceptive: Secondary | ICD-10-CM

## 2016-05-20 MED ORDER — ETONOGESTREL-ETHINYL ESTRADIOL 0.12-0.015 MG/24HR VA RING: VAGINAL_RING | VAGINAL | 12 refills | Status: DC

## 2016-05-20 NOTE — Progress Notes (Signed)
Subjective:     Melody Mcclure is a 26 y.o. female who presents for a postpartum visit. She is 6 weeks postpartum following a low cervical transverse Cesarean section. I have fully reviewed the prenatal and intrapartum course. The delivery was at 34 gestational weeks. Outcome: primary cesarean section, low transverse incision. Anesthesia: spinal. Postpartum course has been unremarkable. Baby's course has been unremarkable. Baby is feeding by bottle - Similac Neosure. Bleeding moderate to heavy. Bowel function is normal. Bladder function is normal. Patient is not sexually active. Contraception method is none. Patient is interested in the Nuvaring. Depression/anxiety screening: negative.  The following portions of the patient's history were reviewed and updated as appropriate: allergies, current medications, past family history, past medical history, past social history, past surgical history and problem list.  Review of Systems Pertinent items noted in HPI and remainder of comprehensive ROS otherwise negative.   Objective:    Ht 5\' 7"  (1.702 m)   Wt 139 lb (63 kg)   BMI 21.77 kg/m   General:  alert, cooperative and appears stated age  Skin: Incision is well healed  Abdomen: soft, non-tender; bowel sounds normal; no masses,  no organomegaly        Assessment:    Normal postpartum exam. Pap smear not done at today's visit--due 08/2018.   Plan:    1. Contraception: NuvaRing vaginal inserts 2. [facility] 3. Follow up in: 3 months or as needed.

## 2016-05-20 NOTE — Patient Instructions (Signed)
Contraception Choices Birth control (contraception) is the use of any methods or devices to stop pregnancy from happening. Below are some methods to help avoid pregnancy. Hormonal birth control  A small tube put under the skin of the upper arm (implant). The tube can stay in place for 3 years. The implant must be taken out after 3 years.  Shots given every 3 months.  Pills taken every day.  Patches that are changed once a week.  A ring put into the vagina (vaginal ring). The ring is left in place for 3 weeks and removed for 1 week. Then, a new ring is put in the vagina.  Emergency birth control pills taken after unprotected sex (intercourse). Barrier birth control  A thin covering worn on the penis (female condom) during sex.  A soft, loose covering put into the vagina (female condom) before sex.  A rubber bowl that sits over the cervix (diaphragm). The bowl must be made for you. The bowl is put into the vagina before sex. The bowl is left in place for 6 to 8 hours after sex.  A small, soft cup that fits over the cervix (cervical cap). The cup must be made for you. The cup can be left in place for 48 hours after sex.  A sponge that is put into the vagina before sex.  A chemical that kills or stops sperm from getting into the cervix and uterus (spermicide). The chemical may be a cream, jelly, foam, or pill. Intrauterine (IUD) birth control  IUD birth control is a small, T-shaped piece of plastic. The plastic is put inside the uterus. There are 2 types of IUD:  Copper IUD. The IUD is covered in copper wire. The copper makes a fluid that kills sperm. It can stay in place for 10 years.  Hormone IUD. The hormone stops pregnancy from happening. It can stay in place for 5 years. Permanent methods  When the woman has her fallopian tubes sealed, tied, or blocked during surgery. This stops the egg from traveling to the uterus.  The doctor places a small coil or insert into each fallopian  tube. This causes scar tissue to form and blocks the fallopian tubes.  When the female has the tubes that carry sperm tied off (vasectomy). Natural family planning birth control  Natural family planning means not having sex or using barrier birth control on the days the woman could become pregnant.  Use a calendar to keep track of the length of each period and know the days she can get pregnant.  Avoid sex during ovulation.  Use a thermometer to measure body temperature. Also watch for symptoms of ovulation.  Time sex to be after the woman has ovulated. Use condoms to help protect yourself against sexually transmitted infections (STIs). Do this no matter what type of birth control you use. Talk to your doctor about which type of birth control is best for you. This information is not intended to replace advice given to you by your health care provider. Make sure you discuss any questions you have with your health care provider. Document Released: 02/22/2009 Document Revised: 10/03/2015 Document Reviewed: 11/16/2012 Elsevier Interactive Patient Education  2017 [employer]  

## 2016-05-20 NOTE — BH Specialist Note (Signed)
Session Start time: 3:10   End Time: 3:30 Total Time:  20 minutes Type of Service: Behavioral Health - Individual/Family Interpreter: No.   Interpreter Name & Language: n/a # [facility]  SUBJECTIVE: Melody Mcclure is a 26 y.o. female  Pt. was referred by f/u for:  anxiety. Pt. reports the following symptoms/concerns: Pt states that she had the most difficulty the first two weeks  Postpartum; being able to talk about her feelings helps her to cope. Pt's primary concern today is going back to work tomorrow, and being concerned about getting baby and herself on a sleep schedule, which she feels will help to cope best. 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  Pt may benefit from brief therapeutic interventions regarding coping with symptoms of anxiety.   PLAN: 1. F/U with behavioral health clinician: As needed 2. Behavioral Health meds: none 3. Behavioral recommendations:  -Continue turning lights out at night, to improve sleep for self and baby -Continue using phone app for baby's sleep; consider obtaining sound machine with sounds that mom and baby both like for sleep -Attend Baby &  Me(Thursdays 11am)  and Mom Talk(Mondays 6pm) classes [facility], at least one time each in January 2018 4. Referral: Brief Counseling/Psychotherapy 5. From scale of 1-10, how likely are you to follow plan: 9  Rae LipsJamie C Mcmannes LCSWA Behavioral Health Clinician  Marlon PelWarmhandoff: no  Depression screen [facility] 05/20/2016 03/19/2016 03/05/2016 12/23/2015  Decreased Interest 3 1 0 0  Down, Depressed, Hopeless 1 1 1 1   PHQ - 2 Score 4 2 1 1   Altered sleeping 0 1 1 0  Tired, decreased energy 0 1 1 0  Change in appetite 1 3 2 1   Feeling bad or failure about yourself  0 1 1 0  Trouble concentrating 0 0 0 0  Moving slowly or fidgety/restless 0 0 0 0  Suicidal thoughts 0 0 0 0  PHQ-9 Score 5 8 6 2    GAD 7 : Generalized Anxiety Score 05/20/2016 03/19/2016 03/05/2016 12/23/2015  Nervous, Anxious, on Edge 1 2 3 1   Control/stop worrying 2 3 3 2   Worry too much - different things 1 3 3 2   Trouble relaxing 1 2 3 2   Restless 0 1 0 0  Easily annoyed or irritable 3 3 3 2   Afraid - awful might happen 0 0 1 0  Total GAD 7 Score 8 14 16  9

## 2016-07-03 ENCOUNTER — Encounter (HOSPITAL_COMMUNITY): Payer: Self-pay | Admitting: Emergency Medicine

## 2016-07-03 DIAGNOSIS — Z113 Encounter for screening for infections with a predominantly sexual mode of transmission: Secondary | ICD-10-CM | POA: Insufficient documentation

## 2016-07-03 NOTE — ED Provider Notes (Signed)
  [facility]-EMERGENCY DEPT Provider Note   CSN: [id number] Arrival date & time: 07/03/16  2209     History   Chief Complaint Chief Complaint  Patient presents with  . Exposure to STD    HPI Melody Mcclure is a 26 y.o. female.  HPI Pt reports possible exposure to STI. Reports she received a text message. No abdominal pain. No vaginal discharge or pain with intercourse. No other complaints at this time   History reviewed. No pertinent past medical history.  There are no active problems to display for this patient.   History reviewed. No pertinent surgical history.  OB History    No data available       Home Medications    Prior to Admission medications   Not on File    Family History No family history on file.  Social History Social History  Substance Use Topics  . Smoking status: Never Smoker  . Smokeless tobacco: Never Used  . Alcohol use No     Allergies   Patient has no allergy information on record.   Review of Systems Review of Systems  All other systems reviewed and are negative.    Physical Exam Updated Vital Signs BP 119/83 (BP Location: Left Arm)   Pulse 84   Temp 98 F (36.7 C) (Oral)   Resp 19   LMP 06/29/2016   SpO2 94%   Physical Exam  Constitutional: She is oriented to person, place, and time. She appears well-developed and well-nourished.  HENT:  Head: Normocephalic.  Eyes: EOM are normal.  Neck: Normal range of motion.  Pulmonary/Chest: Effort normal.  Abdominal: She exhibits no distension.  Musculoskeletal: Normal range of motion.  Neurological: She is alert and oriented to person, place, and time.  Psychiatric: She has a normal mood and affect.  Nursing note and vitals reviewed.    ED Treatments / Results  Labs (all labs ordered are listed, but only abnormal results are displayed) Labs Reviewed - No data to display  EKG  EKG Interpretation None       Radiology No results found.  Procedures Procedures  (including critical care time)  Medications Ordered in ED Medications - No data to display   Initial Impression / Assessment and Plan / ED Course  I have reviewed the triage vital signs and the nursing notes.  Pertinent labs & imaging results that were available during my care of the patient were reviewed by me and considered in my medical decision making (see chart for details).     Referred to health department for STI screening. Asymptomatic now  Final Clinical Impressions(s) / ED Diagnoses   Final diagnoses:  Possible exposure to STD    New Prescriptions New Prescriptions   No medications on file     Azalia BilisKevin Allessandra Bernardi, MD 07/04/16 0000
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
## 2016-07-04 ENCOUNTER — Emergency Department (HOSPITAL_COMMUNITY)
Admission: EM | Admit: 2016-07-04 | Discharge: 2016-07-05 | Disposition: A | Discharge status: Home / Self Care | Payer: Medicaid Other | Attending: Emergency Medicine | Admitting: Emergency Medicine

## 2016-07-04 DIAGNOSIS — Z202 Contact with and (suspected) exposure to infections with a predominantly sexual mode of transmission: Secondary | ICD-10-CM

## 2016-07-06 ENCOUNTER — Encounter (HOSPITAL_COMMUNITY): Payer: Self-pay | Admitting: Obstetrics & Gynecology

## 2016-07-07 ENCOUNTER — Inpatient Hospital Stay (HOSPITAL_COMMUNITY)
Admission: AD | Admit: 2016-07-07 | Discharge: 2016-07-07 | Discharge status: Against Medical Advice | Payer: Medicaid Other | Source: Ambulatory Visit | Attending: Family Medicine | Admitting: Family Medicine

## 2016-07-07 DIAGNOSIS — Z5321 Procedure and treatment not carried out due to patient leaving prior to being seen by health care provider: Secondary | ICD-10-CM | POA: Insufficient documentation

## 2016-07-07 DIAGNOSIS — Z202 Contact with and (suspected) exposure to infections with a predominantly sexual mode of transmission: Secondary | ICD-10-CM | POA: Insufficient documentation

## 2016-07-07 MED ORDER — CEFTRIAXONE SODIUM 250 MG IJ SOLR: 250.0000 mg | Freq: Once | INTRAMUSCULAR | Status: DC

## 2016-07-07 MED ORDER — METRONIDAZOLE 500 MG PO TABS: 2000.0000 mg | ORAL_TABLET | Freq: Once | ORAL | Status: DC

## 2016-07-07 MED ORDER — AZITHROMYCIN 250 MG PO TABS: 1000.0000 mg | ORAL_TABLET | Freq: Once | ORAL | Status: DC

## 2016-07-07 NOTE — MAU Note (Signed)
Not in lobby x2.

## 2016-07-07 NOTE — MAU Note (Signed)
NOT IN LOBBY 

## 2016-07-07 NOTE — MAU Note (Signed)
Urine in lab 

## 2016-07-07 NOTE — MAU Note (Signed)
Received a text, anonymous notification, ?possible STD exposure.  Has a d/c, thinks possible BV

## 2016-08-28 ENCOUNTER — Encounter: Payer: BLUE CROSS/BLUE SHIELD | Admitting: Women's Health

## 2016-09-23 ENCOUNTER — Encounter: Payer: Self-pay | Admitting: Gynecology

## 2017-05-22 ENCOUNTER — Other Ambulatory Visit: Payer: Self-pay | Admitting: Family Medicine

## 2017-05-22 DIAGNOSIS — Z30015 Encounter for initial prescription of vaginal ring hormonal contraceptive: Secondary | ICD-10-CM

## 2018-05-29 IMAGING — US US MFM FETAL NUCHAL TRANSLUCENCY
1 series · 14 of 28 positions shown · non-contrast
Comparison: none

[Series 1: us mfm fetal nuchal translucency · 14 of 74 slices shown]
[im 3/74]
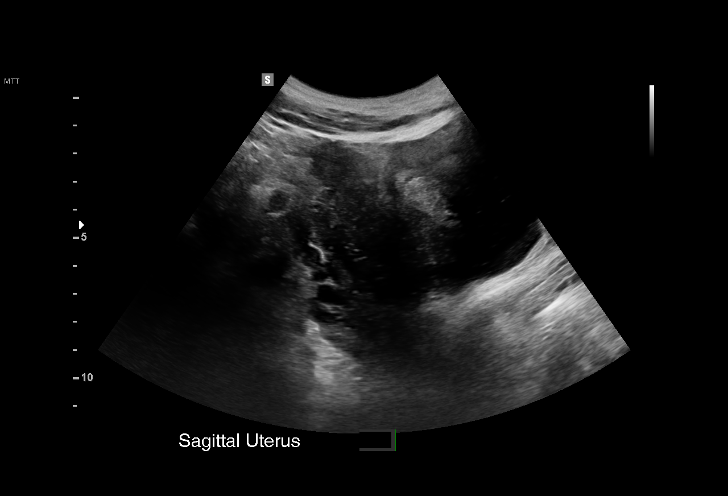
[im 9/74]
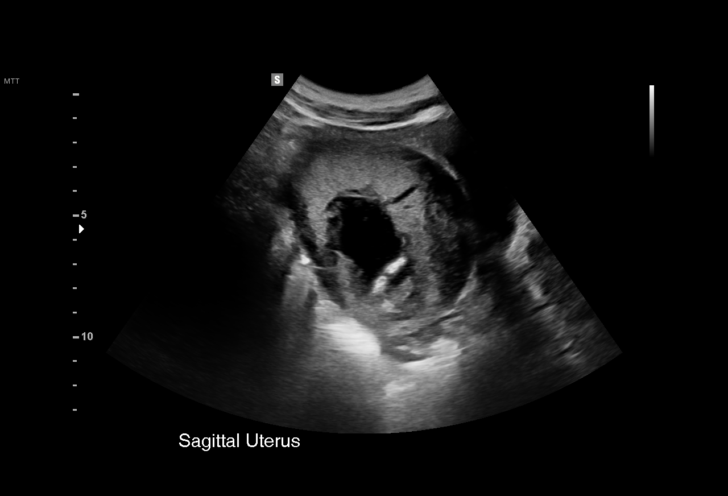
[im 14/74]
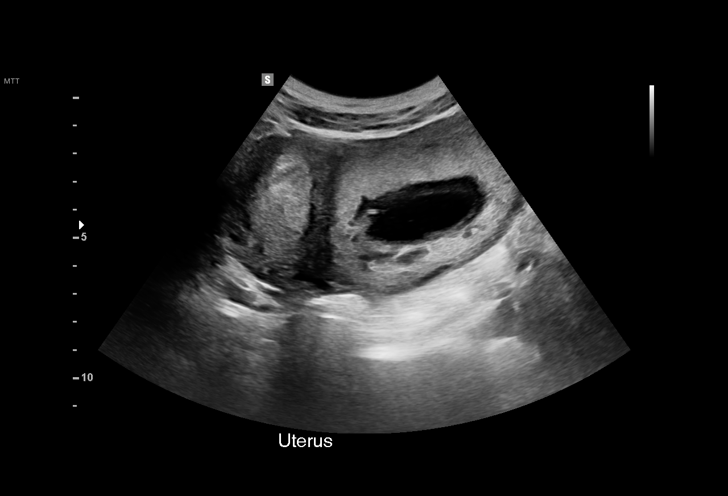
[im 19/74]
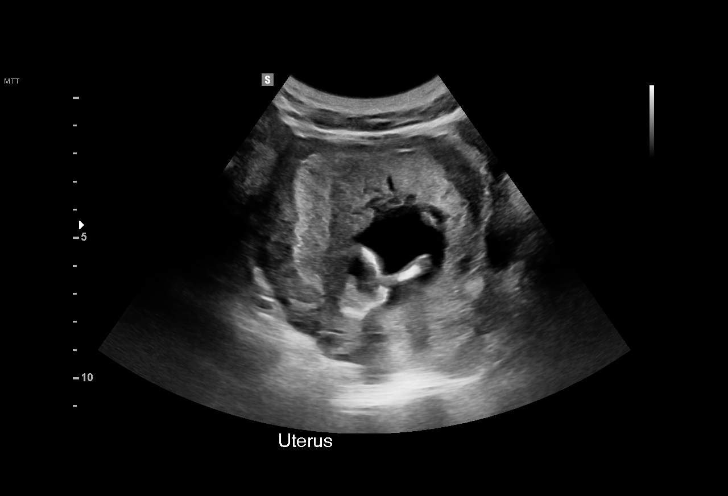
[im 25/74]
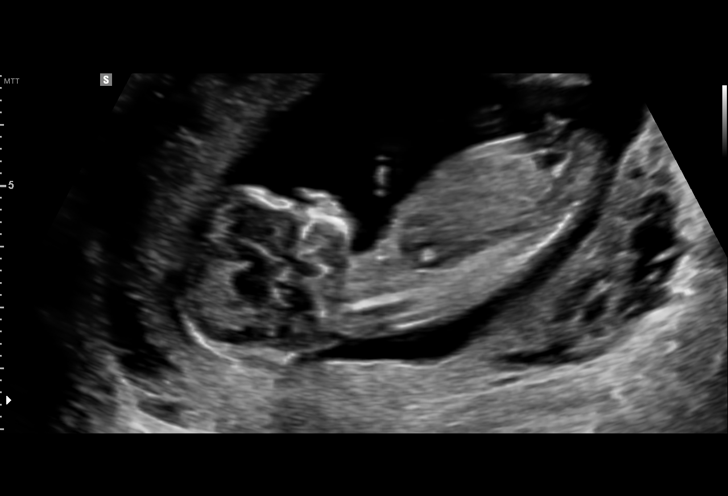
[im 30/74]
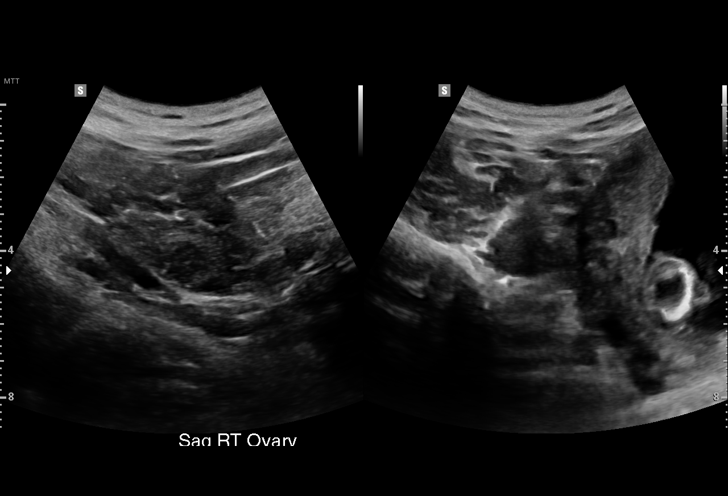
[im 36/74]
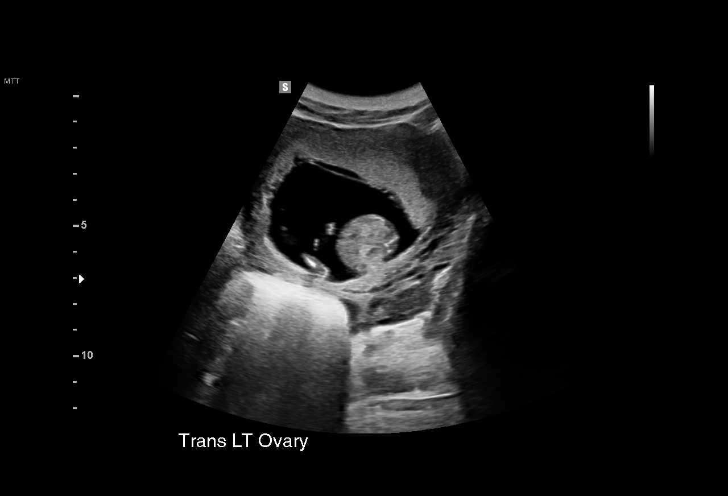
[im 41/74]
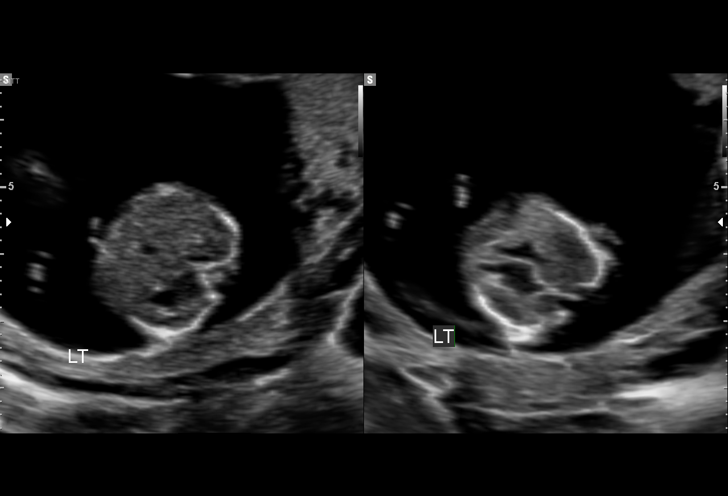
[im 46/74]
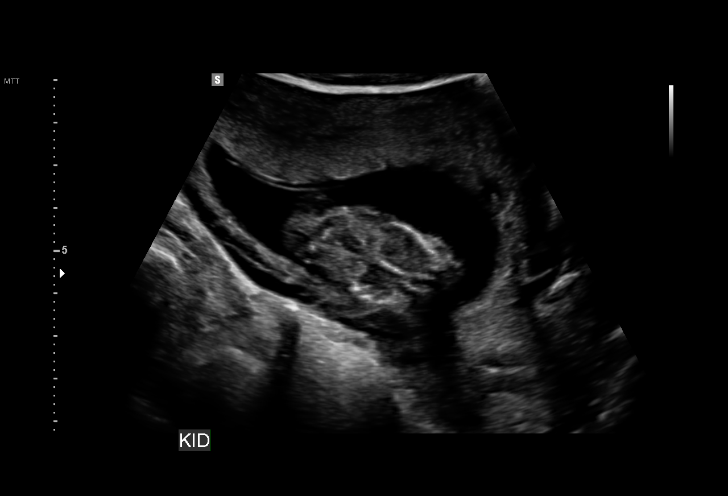
[im 52/74]
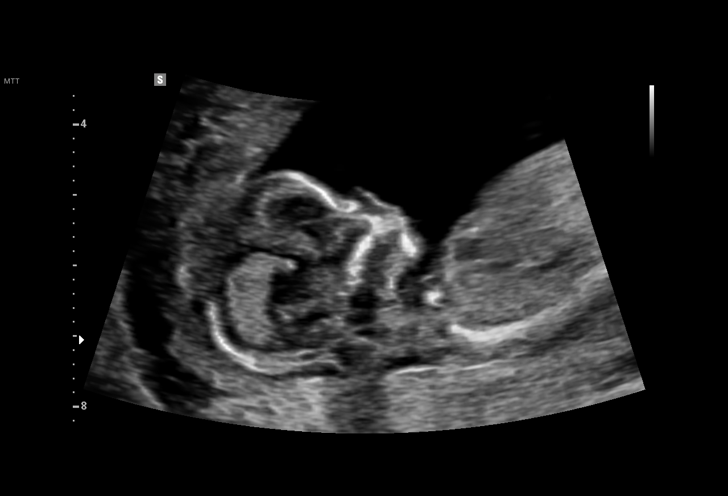
[im 57/74]
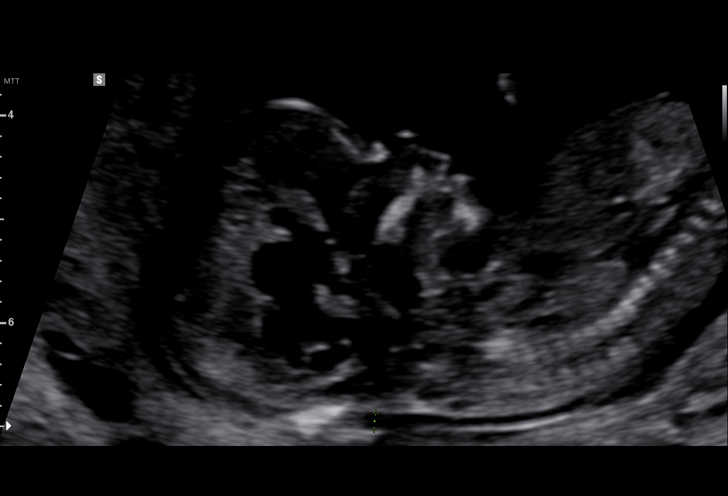
[im 63/74]
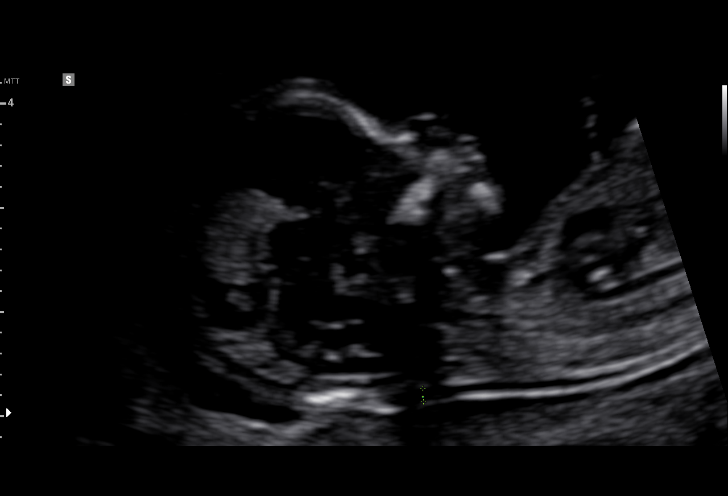
[im 68/74]
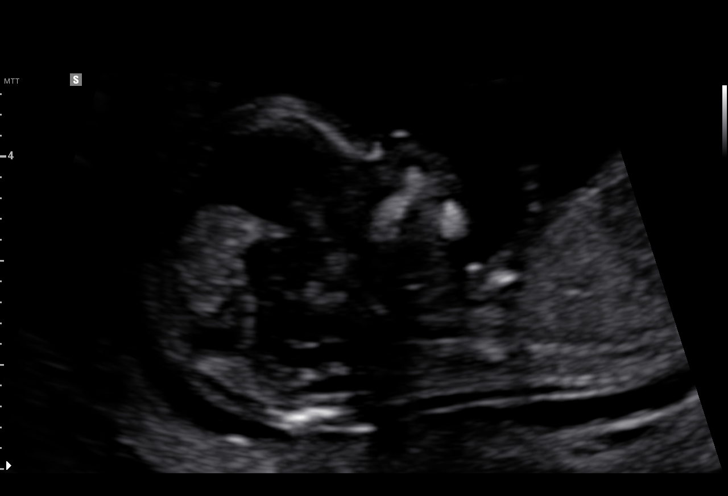
[im 74/74]
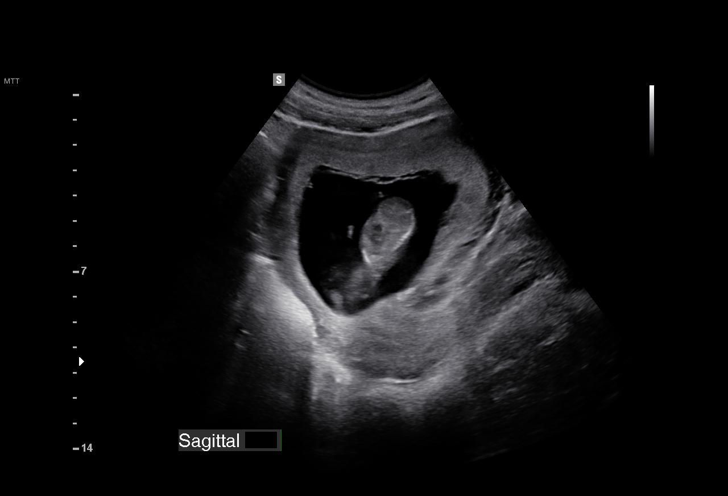

[14 of 28 positions shown; findings below may reference images not displayed]

Hospital Clinic-
Faculty Physician
OB/Gyn Clinic
08

TRANSLUCENCY

1  TAMEKA LAVI              [id number]      [id number]     [id number]
Indications

13 weeks gestation of pregnancy
First trimester aneuploidy screen (NT)         Z36
Insufficient Prenatal Care
OB History

Gravidity:    1         Term:   0        Prem:   0        SAB:   0
TOP:          0       Ectopic:  0        Living: 0
Fetal Evaluation

Num Of Fetuses:     1
Fetal Heart         149
Rate(bpm):
Cardiac Activity:   Observed
Presentation:       Variable
Placenta:           Anterior

Amniotic Fluid
AFI FV:      Subjectively within normal limits
Gestational Age
Best:          13w 1d    Det. By:   Early Ultrasound         EDD:   05/20/16
(10/06/15)
1st Trimester Genetic Sonogram Screening

CRL:            75.2  mm    G. Age:   13w 2d                 EDD:   05/19/16
Nuc Trans:       1.6  mm
Nasal Bone:                 Present
Anatomy

Choroid Plexus:        Visualized             Upper Extremities:      Visualized
Stomach:               Visualized             Lower Extremities:      Visualized
Bladder:               Visualized
Cervix Uterus Adnexa

Cervix
Normal appearance by transabdominal scan.

Uterus
Septate.

Left Ovary
Size(cm)     3.71  x    3.69   x  1.59      Vol(ml):
Within normal limits. No adnexal mass visualized.

Right Ovary
Size(cm)     3.01  x    2.08   x  1.67      Vol(ml):
Within normal limits. No adnexal mass visualized.

Cul De Sac:   No free fluid seen.

Adnexa:       No abnormality visualized.
Impression

Single IUP at 13w 1d
Recently diagnosed - bicornuate uterus
Fetus appears to be implanted in the left horn
Normal NT (1.6 mm)  Nasal bone visualized
First trimester aneuploidy screen performed as noted above.
Please do not draw triple/quad screen, though patient should
be offered MSAFP for neural tube defect screening.
Recommendations

Recommend cervical length surveillance  - cervical lengths
every 2 weeks from 16-24 weeks gestation
Ultrasound for fetal anatomy at 18-20 weeks

With new diagnosis of congenital uterine anomaly, consider
renal ultrasound (renal anomalies may be associated with
uterine anomalies)

## 2018-07-03 IMAGING — US US MFM OB DETAIL+14 WK
1 series · 14 of 28 positions shown · non-contrast
Comparison: none

[Series 1: us mfm ob detail+14 wk · 106 acquisitions, 14 frames shown]
[im 4/106]
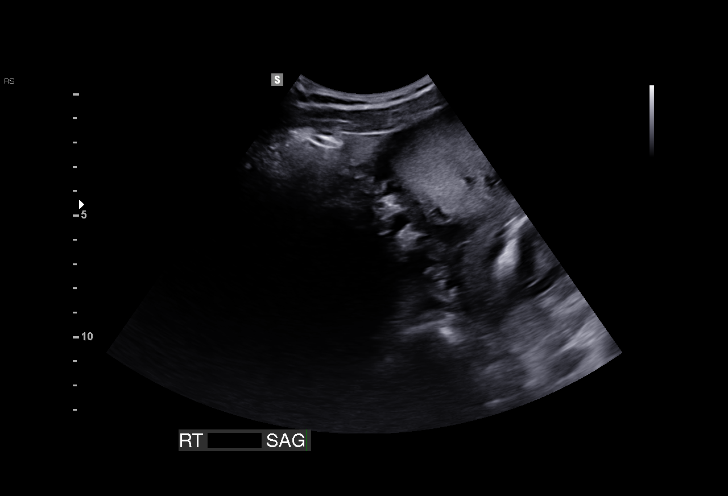
[im 12/106]
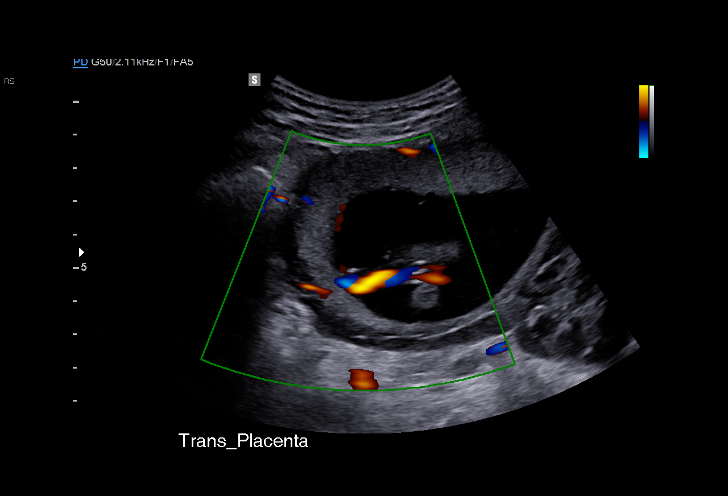
[im 20/106]
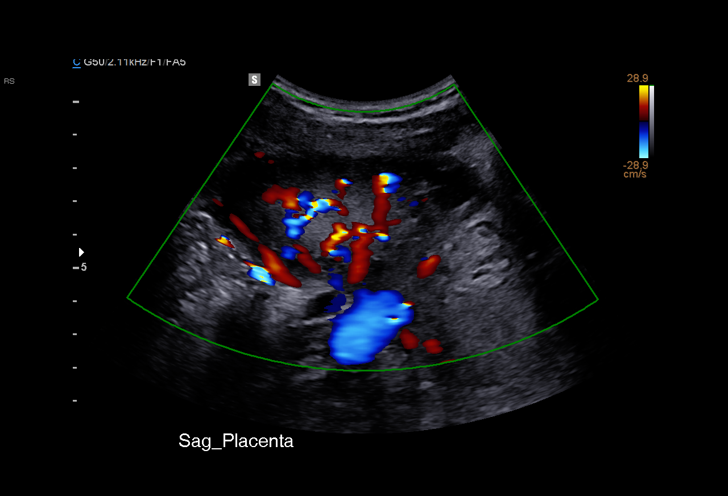
[im 28/106]
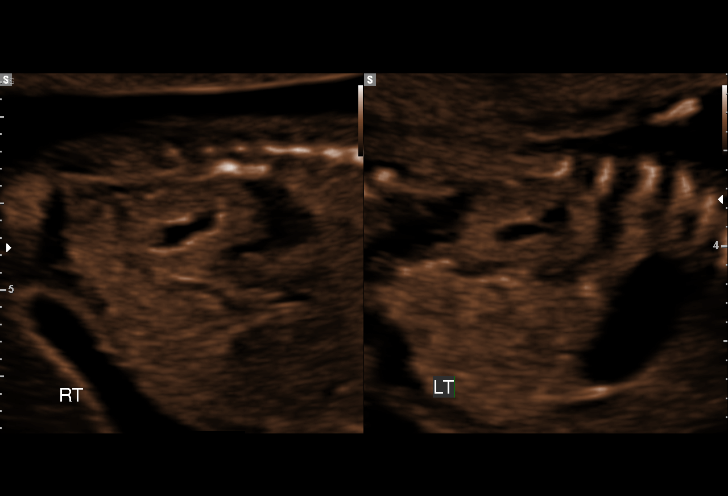
[im 36/106]
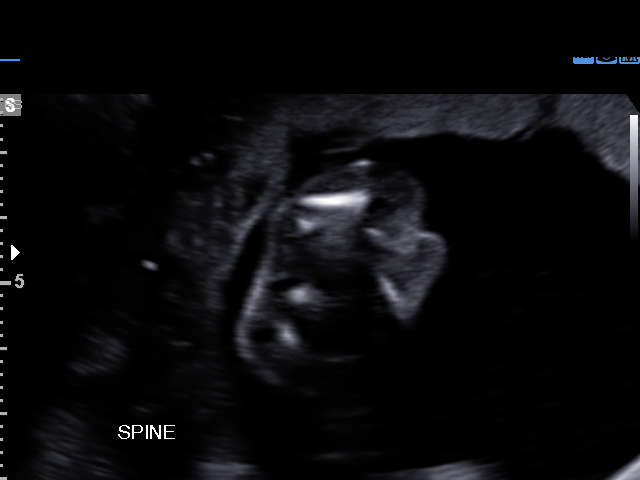
[im 43/106]
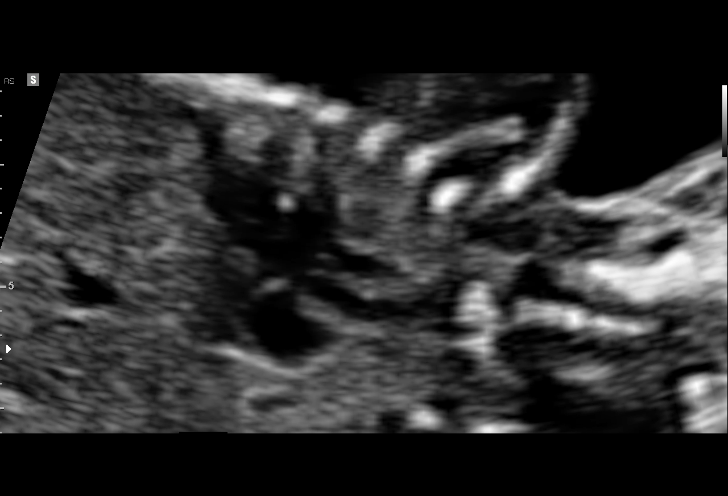
[im 51/106]
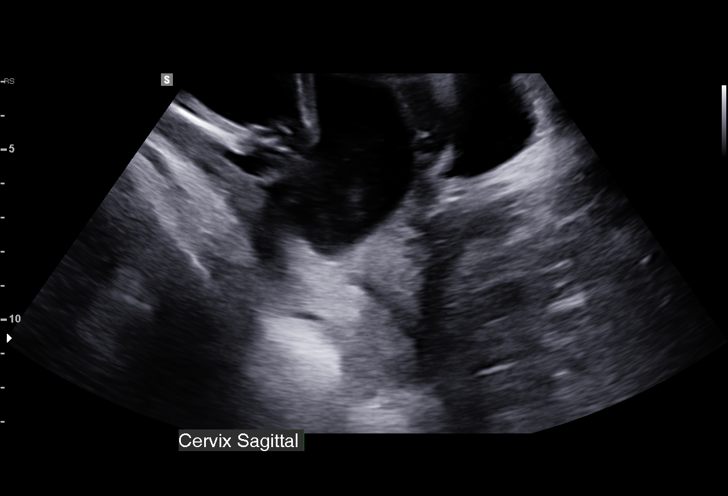
[im 59/106]
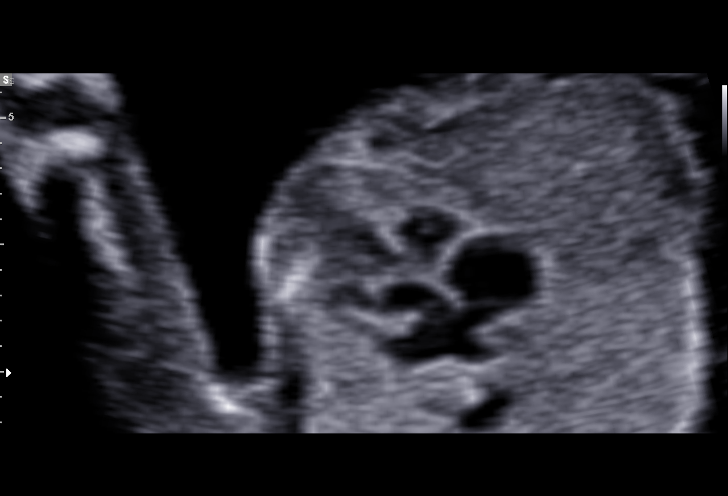
[im 67/106]
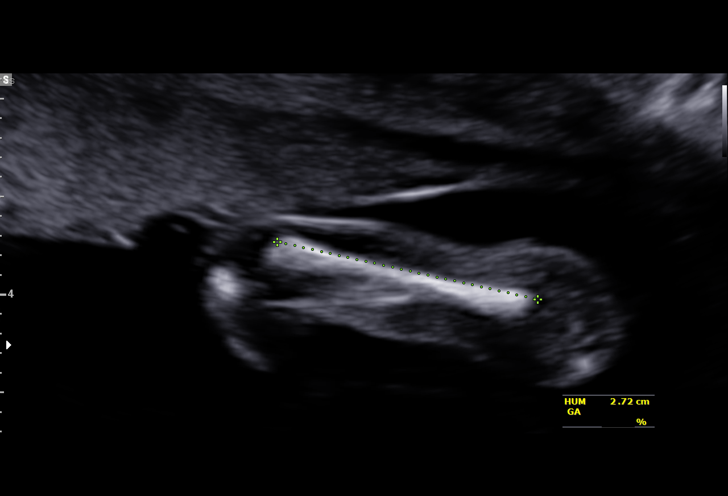
[im 74/106]
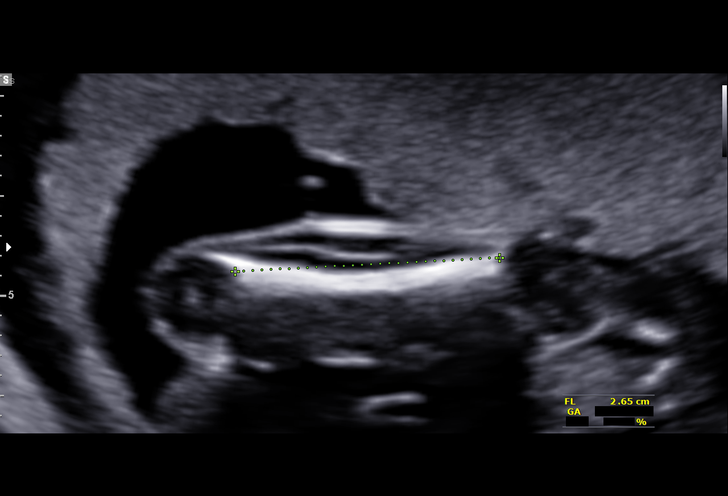
[im 82/106]
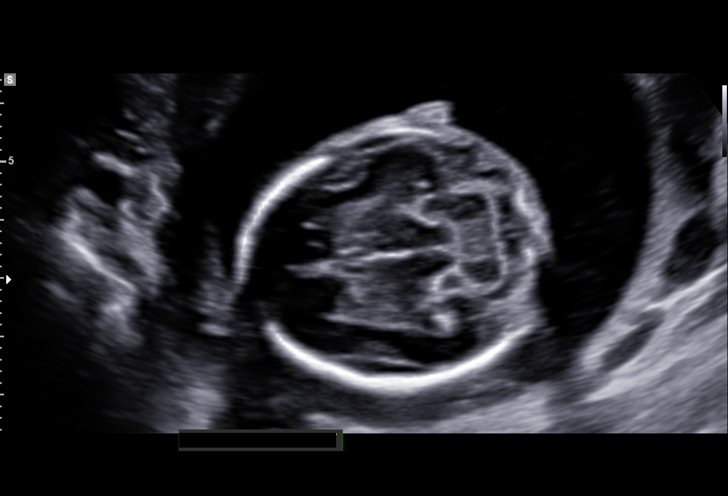
[im 90/106]
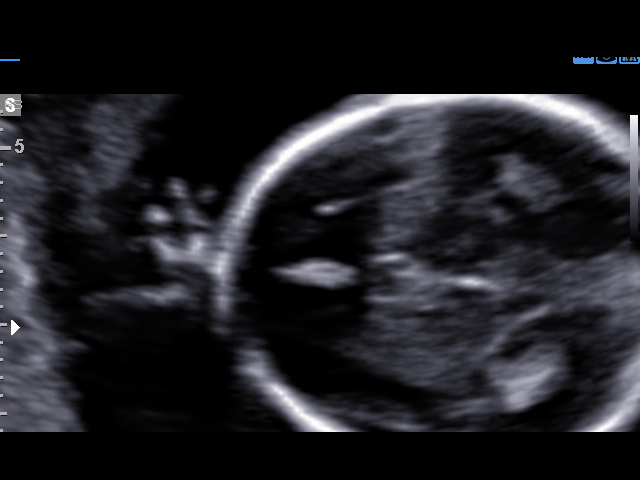
[im 98/106]
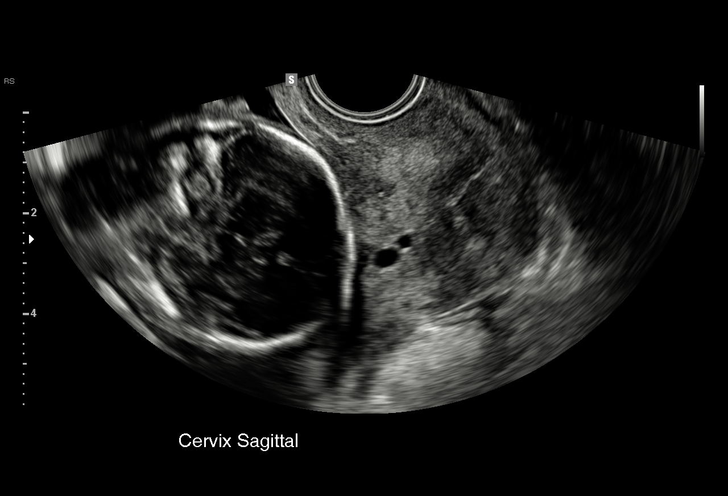
[im 106/106]
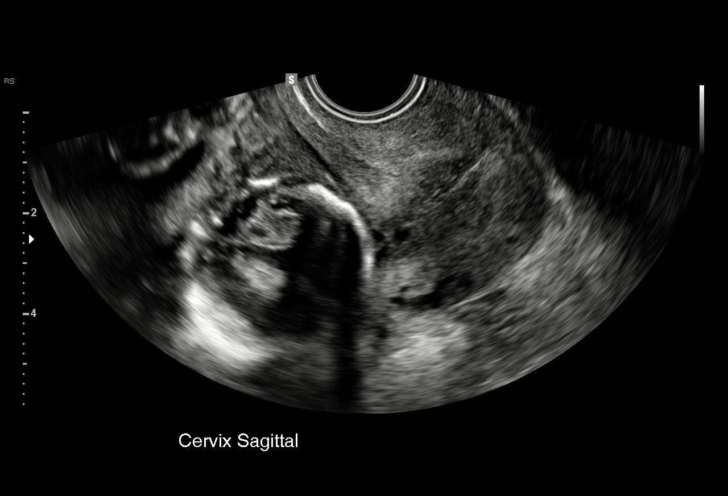

[14 of 28 positions shown; findings below may reference images not displayed]

Hospital Clinic-
Faculty Physician
OB/Gyn Clinic
08

1  ADJAILSON SEILA            [id number]      [id number]     [id number]
2  ADJAILSON SEILA            [id number]      [id number]     [id number]
Indications

18 weeks gestation of pregnancy
Detailed fetal anatomic survey                 Z36
Bicornuate uterus, 2ND trimester; pregnancy
in left horn
Insufficient Prenatal Care
OB History

Gravidity:    1         Term:   0        Prem:   0        SAB:   0
TOP:          0       Ectopic:  0        Living: 0
Fetal Evaluation

Num Of Fetuses:     1
Fetal Heart         151
Rate(bpm):
Cardiac Activity:   Observed
Presentation:       Cephalic
Placenta:           Anterior, above cervical os
P. Cord Insertion:  Visualized
Amniotic Fluid
AFI FV:      Subjectively within normal limits

Largest Pocket(cm)
5.40
Biometry

BPD:      43.4  mm     G. Age:  19w 1d         88  %    CI:        73.22   %   70 - 86
FL/HC:      16.3   %   15.8 - 18
HC:      161.2  mm     G. Age:  18w 6d         79  %    HC/AC:      1.19       1.07 -
AC:      135.7  mm     G. Age:  19w 0d         76  %    FL/BPD:     60.6   %
FL:       26.3  mm     G. Age:  18w 0d         40  %    FL/AC:      19.4   %   20 - 24
HUM:      26.8  mm     G. Age:  18w 4d         65  %
CER:      20.1  mm     G. Age:  19w 1d         79  %
NFT:       2.6  mm
CM:        4.8  mm

Est. FW:     248  gm      0 lb 9 oz     56  %
Gestational Age

U/S Today:     18w 5d                                        EDD:   05/16/16
Best:          18w 1d    Det. By:   Early Ultrasound         EDD:   05/20/16
(10/06/15)
Anatomy

Cranium:               Appears normal         Aortic Arch:            Appears normal
Cavum:                 Appears normal         Ductal Arch:            Appears normal
Ventricles:            Appears normal         Diaphragm:              Appears normal
Choroid Plexus:        Appears normal         Stomach:                Appears normal, left
sided
Cerebellum:            Appears normal         Abdomen:                Appears normal
Posterior Fossa:       Appears normal         Abdominal Wall:         Appears nml (cord
insert, abd wall)
Nuchal Fold:           Appears normal         Cord Vessels:           Appears normal (3
vessel cord)
Face:                  Orbits nl; profile not Kidneys:                Appear normal
well visualized
Lips:                  Appears normal         Bladder:                Appears normal
Thoracic:              Appears normal         Spine:                  Appears normal
Heart:                 Appears normal         Upper Extremities:      Appears normal
(4CH, axis, and
situs)
RVOT:                  Appears normal         Lower Extremities:      Appears normal
LVOT:                  Appears normal

Other:  Parents do not wish to know sex of fetus. ( Male gender) Heels
visualized.
Cervix Uterus Adnexa

Cervix
Length:           3.44  cm.
Normal appearance by transvaginal scan

Uterus
No abnormality visualized.
Left Ovary
Not visualized.

Right Ovary
Not visualized.

Adnexa:       No abnormality visualized. No adnexal mass
visualized.
Impression

SIUP at 18+1 weeks
Normal detailed fetal anatomy; limited views of profile
Markers of aneuploidy: none
Normal amniotic fluid volume
Measurements consistent with early US
EV views of cervix: normal length without funneling
Recommendations

Serial CLs every 2 weeks through 24 weeks
Serial growth US

## 2018-07-16 IMAGING — US US MFM OB TRANSVAGINAL
1 series · 15 of 28 positions shown · non-contrast
Comparison: none

[Series 1: us mfm ob transvaginal · 15 of 56 slices shown]
[im 1/56]
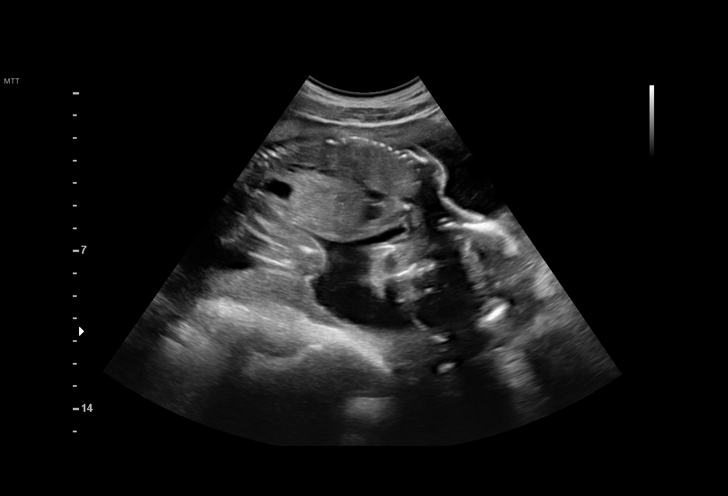
[im 5/56]
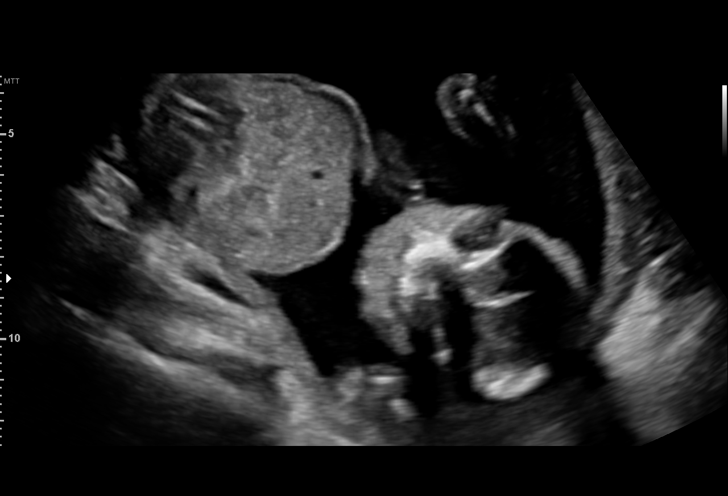
[im 9/56]
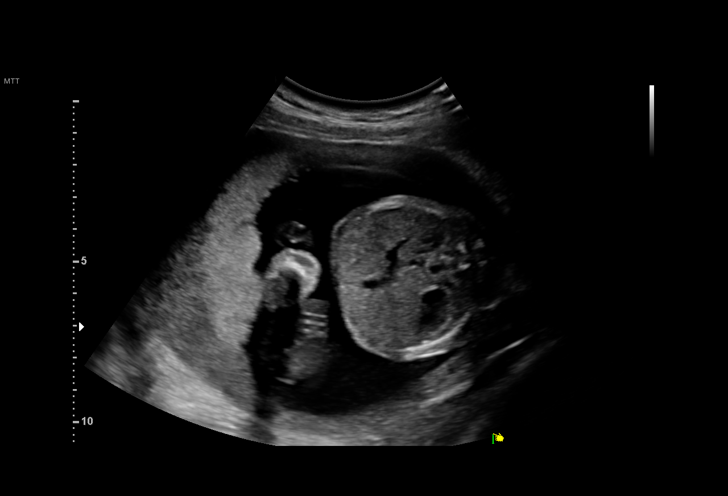
[im 13/56]
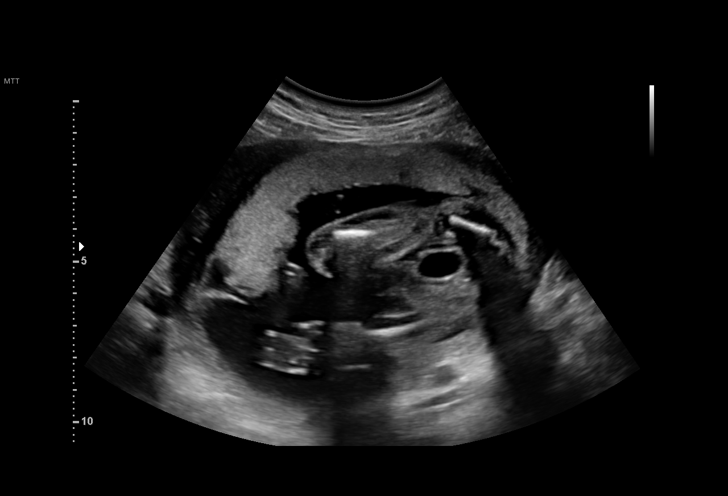
[im 17/56]
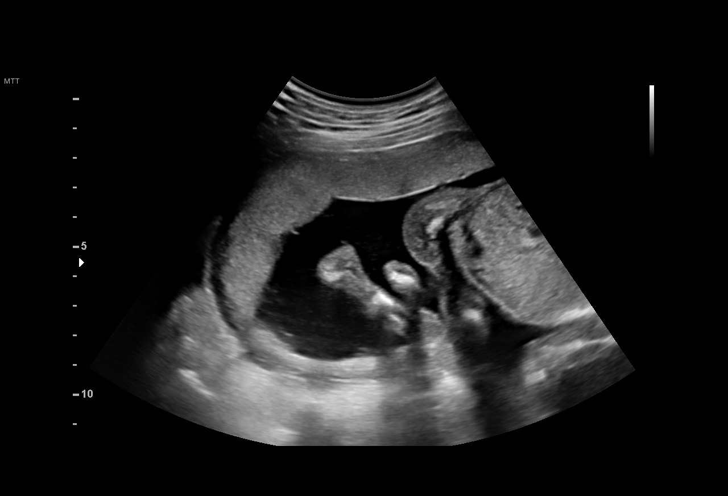
[im 21/56]
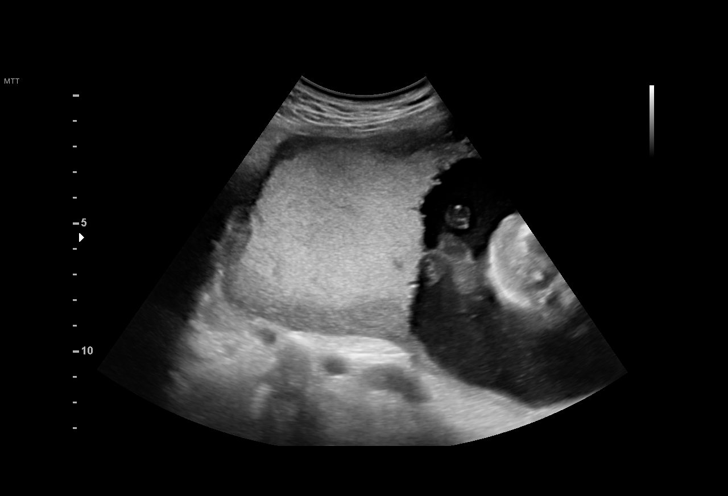
[im 25/56]
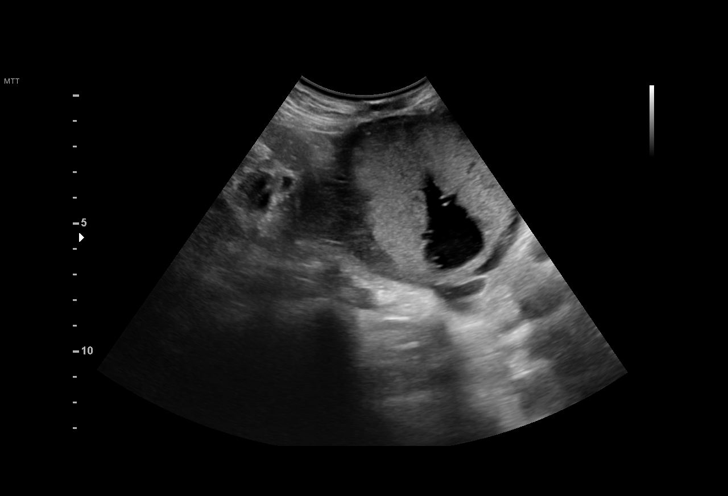
[im 29/56]
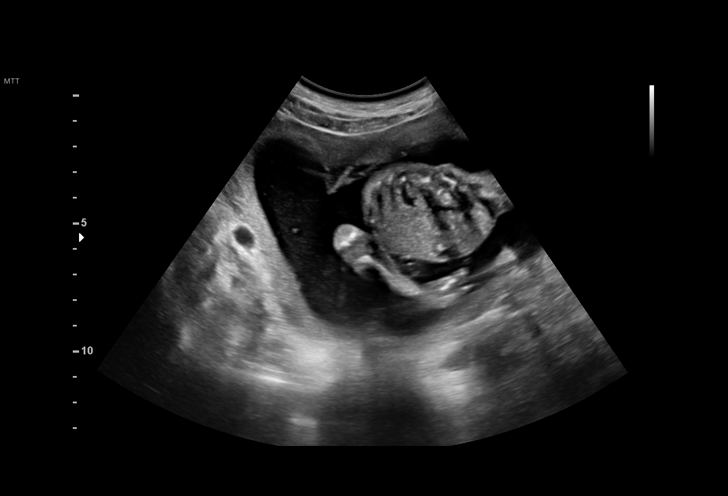
[im 31/56]
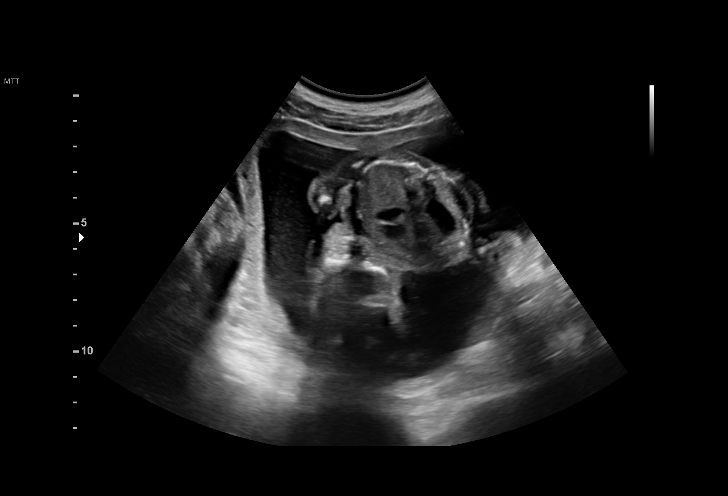
[im 35/56]
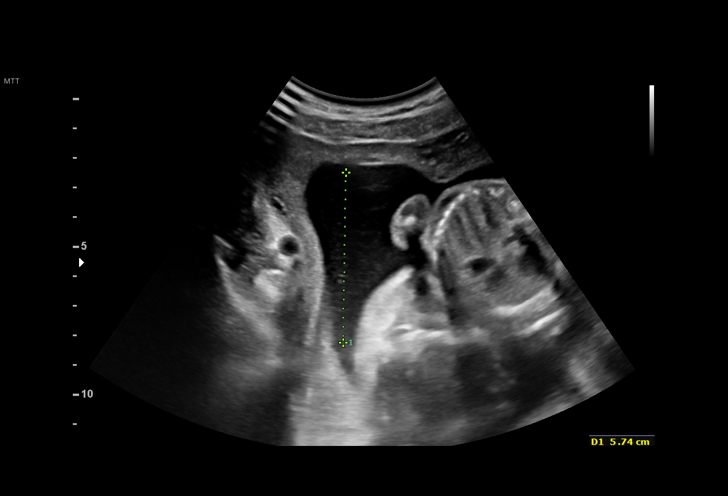
[im 39/56]
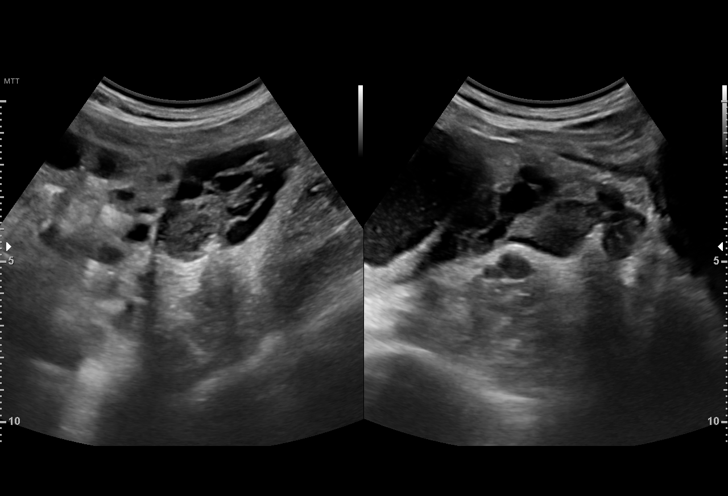
[im 43/56]
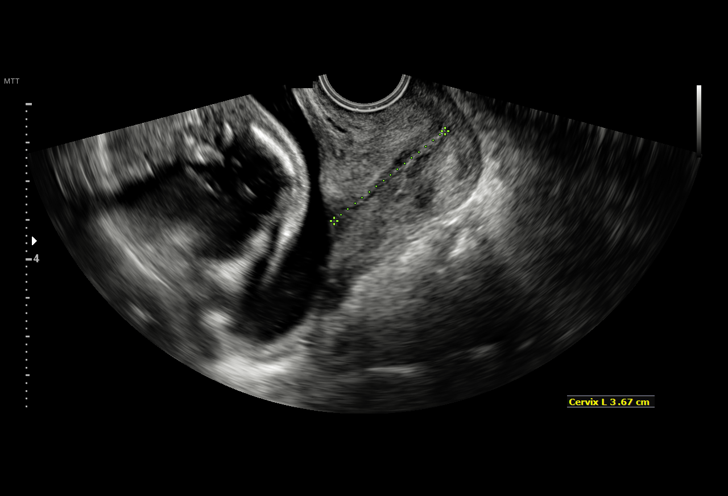
[im 47/56]
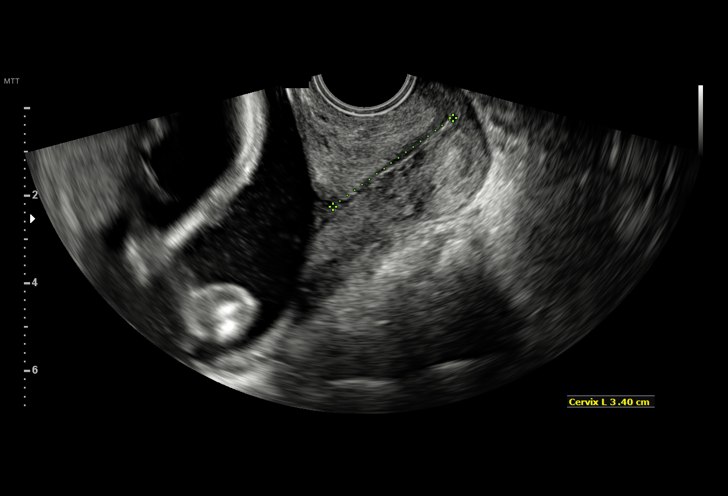
[im 51/56]
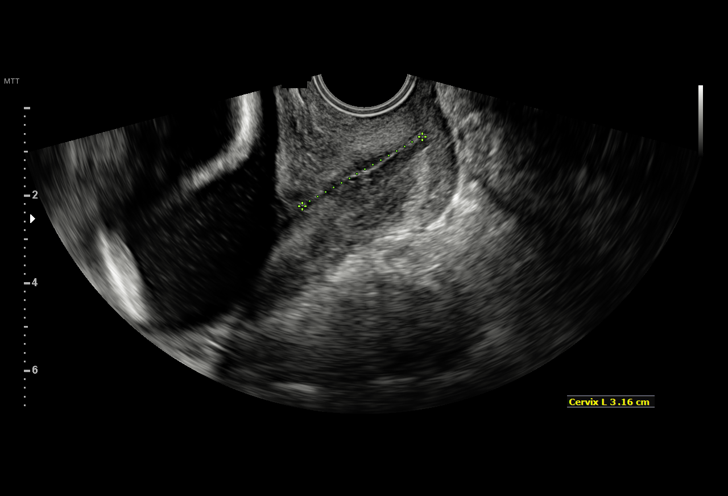
[im 56/56]
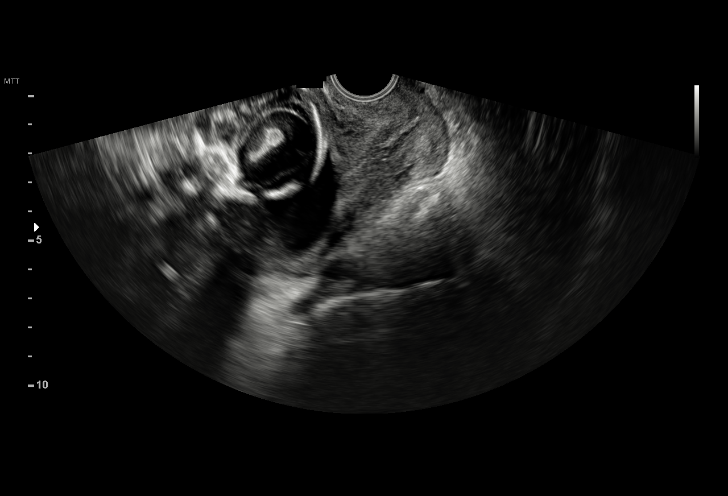

[15 of 28 positions shown; findings below may reference images not displayed]

OB/Gyn Clinic
08

1  OURARI TIGER            [id number]      [id number]     [id number]
Indications

20 weeks gestation of pregnancy
Bicornuate uterus, 2ND trimester; pregnancy
in left horn
OB History

Gravidity:    1         Term:   0        Prem:   0         SAB:   0
TOP:          0       Ectopic:  0        Living: 0
Fetal Evaluation

Num Of Fetuses:     1
Fetal Heart         150
Rate(bpm):
Cardiac Activity:   Observed
Presentation:       Cephalic
Placenta:           Anterior, above cervical os
P. Cord Insertion:  Previously Visualized

Amniotic Fluid
AFI FV:      Subjectively within normal limits

Largest Pocket(cm)
5.7
Gestational Age

Best:          20w 0d     Det. By:  Early Ultrasound         EDD:    05/20/16
(10/06/15)
Cervix Uterus Adnexa

Cervix
Length:            3.7  cm.
Normal appearance by transvaginal scan

Uterus
Bicornuate.

Left Ovary
Size(cm)         2  x   1.9    x  2.3       Vol(ml):
Within normal limits. No adnexal mass visualized.

Right Ovary
No adnexal mass visualized.

Cul De Sac:   No free fluid seen.

Adnexa:       No abnormality visualized.
Impression

SIUP at 20+0 weeks
Normal amniotic fluid volume
EV views of cervix: normal length without funneling
Recommendations

Keep next appt for CL in 2 weeks

## 2018-08-19 IMAGING — US US MFM OB TRANSVAGINAL
1 series · 15 of 28 positions shown · non-contrast
Comparison: none

[Series 1: us mfm ob transvaginal · 46 acquisitions, 15 frames shown]
[im 1/46]
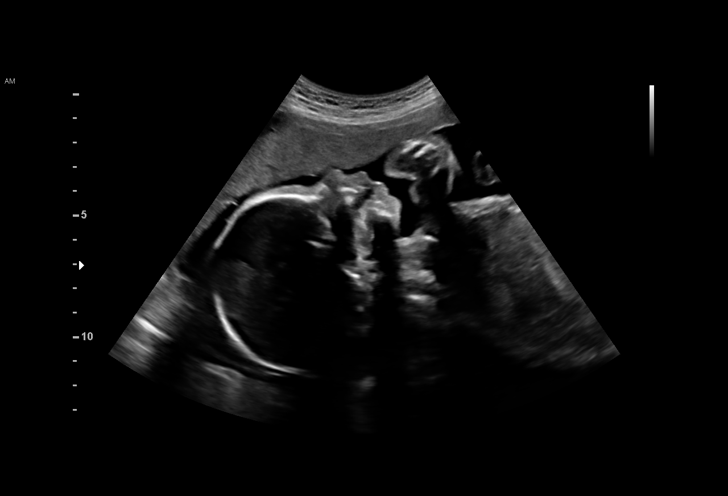
[im 4/46]
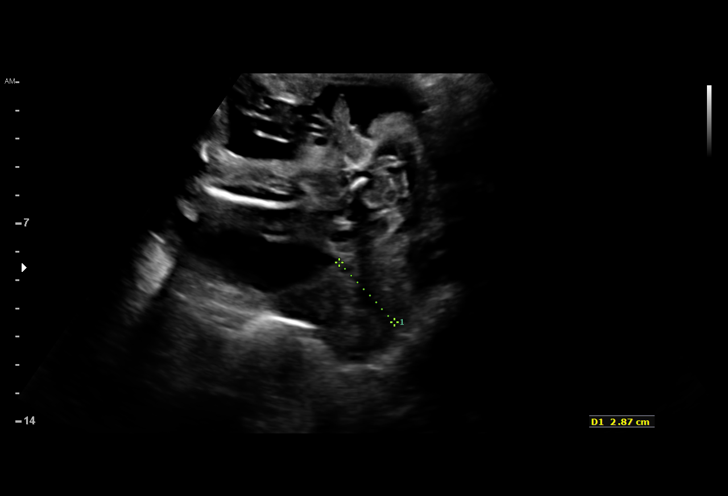
[im 7/46]
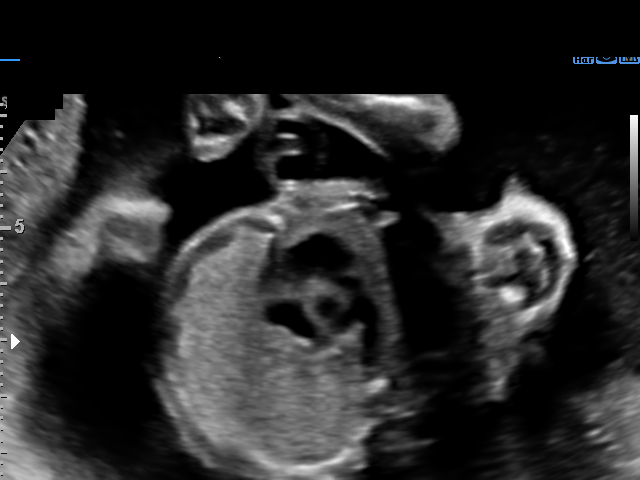
[im 11/46]
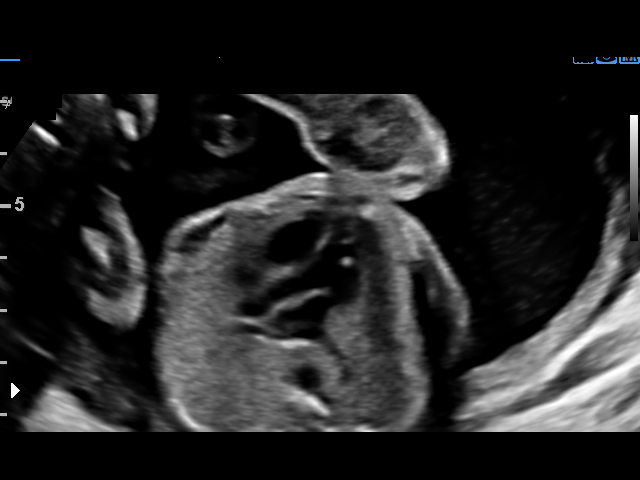
[im 14/46]
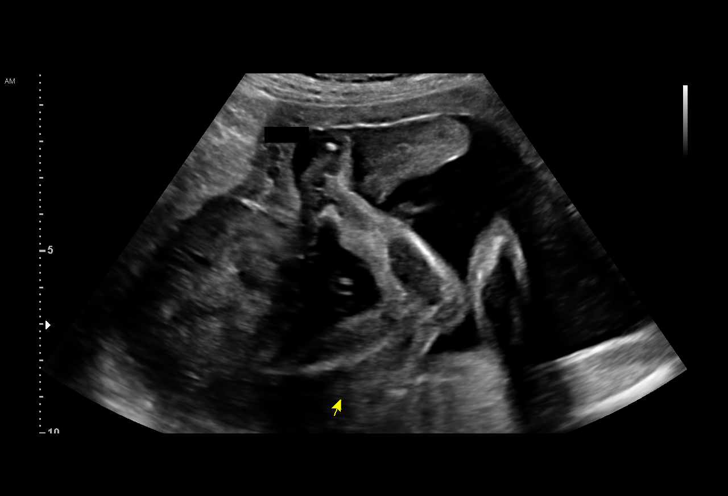
[im 17/46]
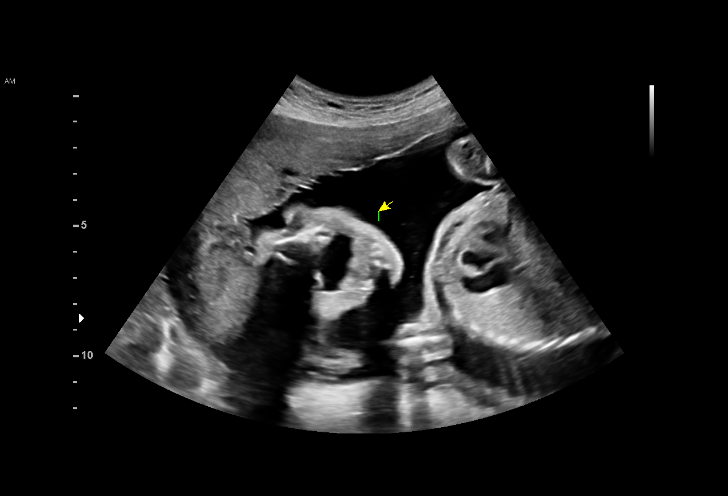
[im 21/46]
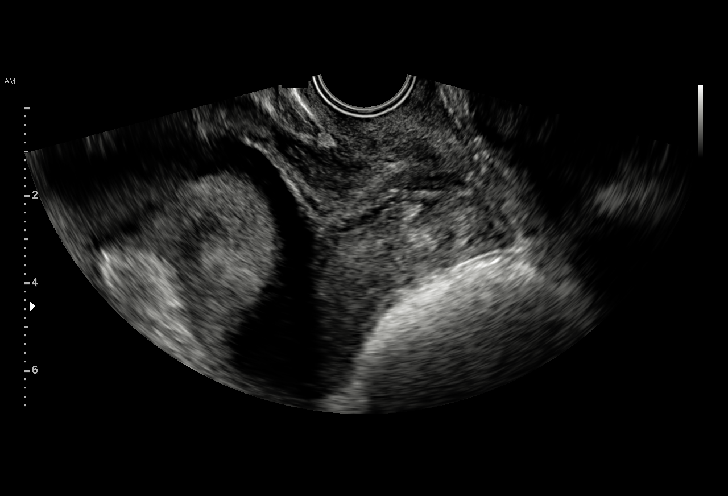
[im 24/46]
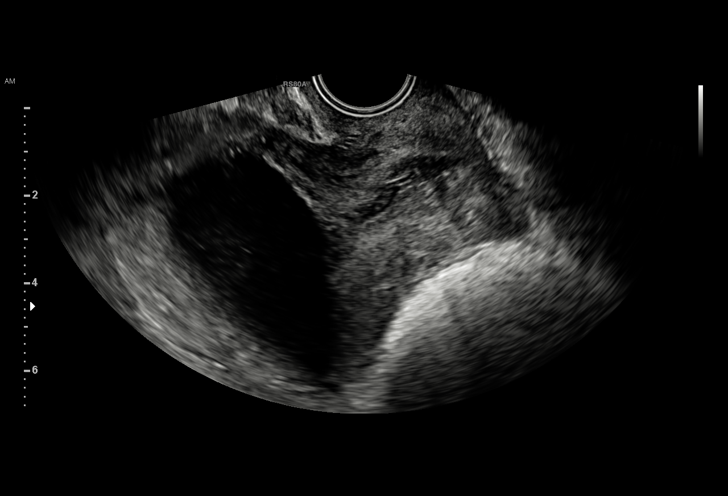
[im 26/46]
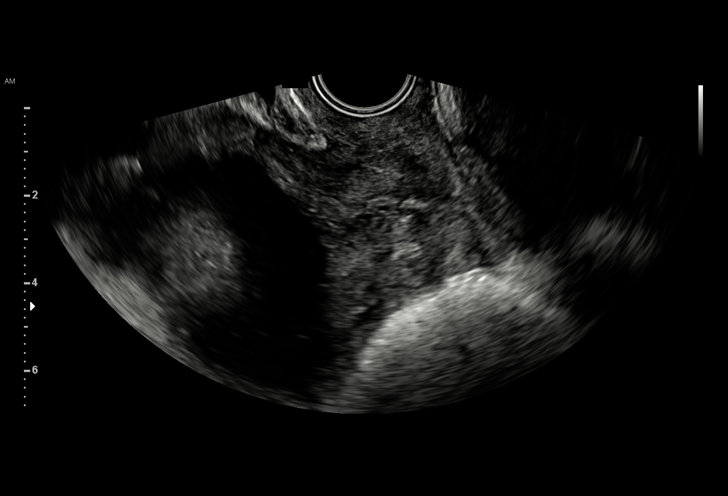
[im 29/46]
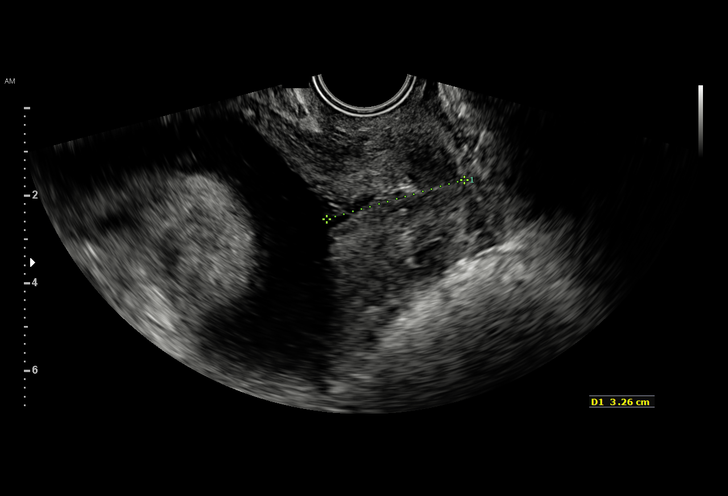
[im 32/46]
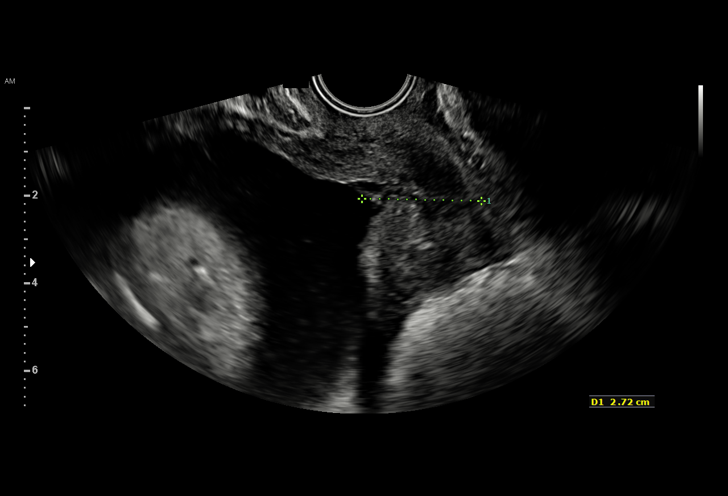
[im 36/46]
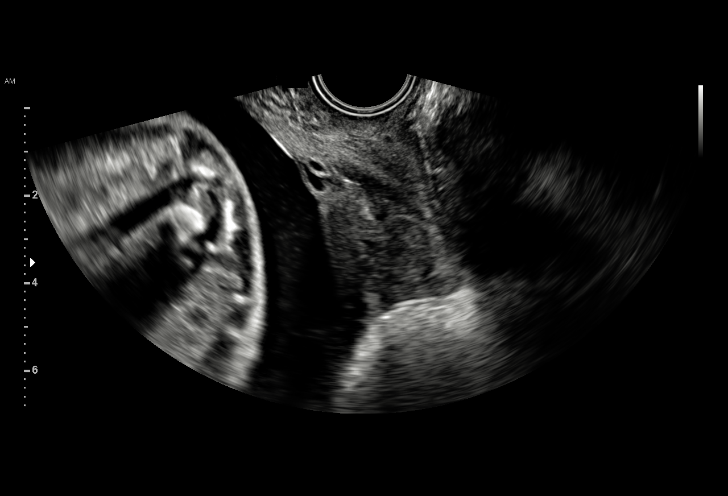
[im 39/46]
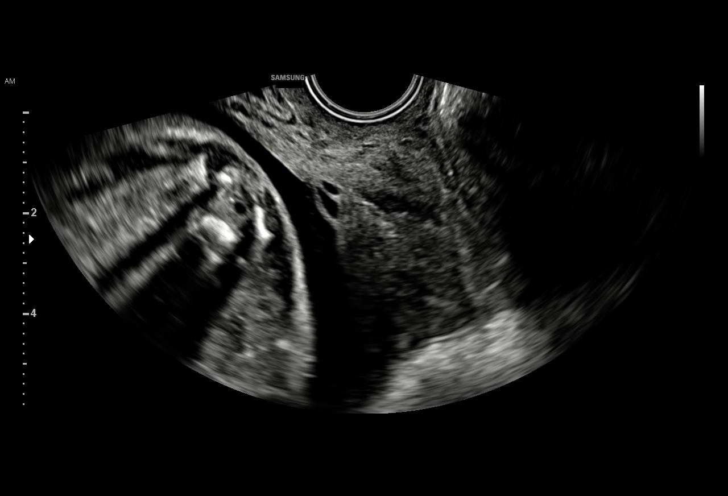
[im 42/46]
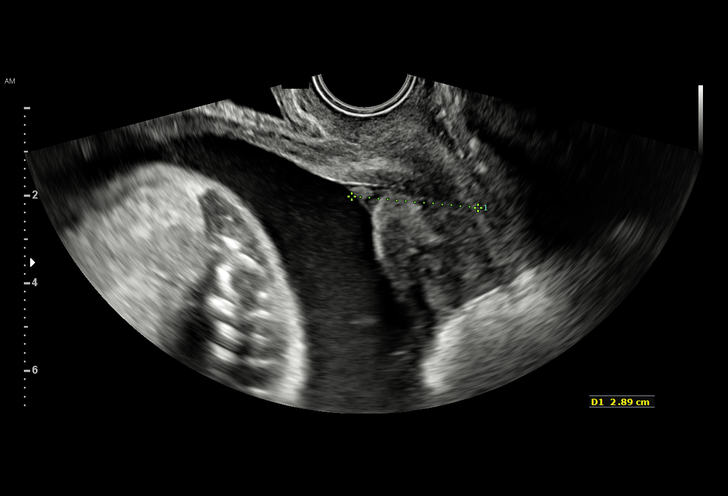
[im 46/46]
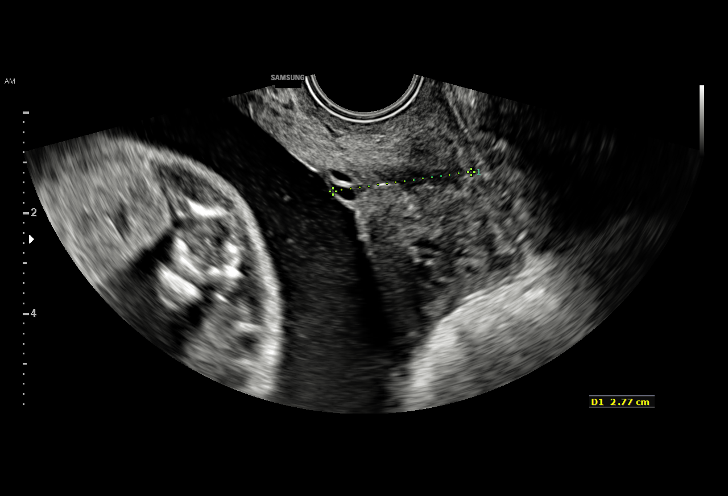

[15 of 28 positions shown; findings below may reference images not displayed]

OB/Gyn Clinic
08

1  SIMA TRAIL           [id number]      [id number]     [id number]
Indications

24 weeks gestation of pregnancy
Bicornuate uterus, 2ND trimester; pregnancy
in left horn
Cervical shortening, second trimester;
vaginal progesterone
OB History

Gravidity:    1         Term:   0        Prem:   0         SAB:   0
TOP:          0       Ectopic:  0        Living: 0
Fetal Evaluation

Num Of Fetuses:     1
Fetal Heart         142
Rate(bpm):
Cardiac Activity:   Observed
Gestational Age

Best:          24w 6d     Det. By:  Early Ultrasound         EDD:    05/20/16
(10/06/15)
Anatomy

Heart:                 Appears normal; EIF    LVOT:                   Appears normal
RVOT:                  Appears normal
Cervix Uterus Adnexa

Cervix
Length:              2  cm.
Measured transvaginally.

Uterus
Bicornuate.
Impression

SIUP at 24+6 weeks
Normal amniotic fluid volume
EV views of cervix: initially normal length; with fundal
pressure, mild funneling and shorteneding; CL 2.0 - 4.0 cms -
no significant change from previous studies
Recommendations

Repeat CL on [REDACTED]; if no further shortening or funneling,
follow cervix clinically

## 2018-08-22 IMAGING — US US MFM OB TRANSVAGINAL
1 series · 15 of 28 positions shown · non-contrast
Comparison: none

[Series 1: us mfm ob transvaginal · 32 acquisitions, 15 frames shown]
[im 1/32]
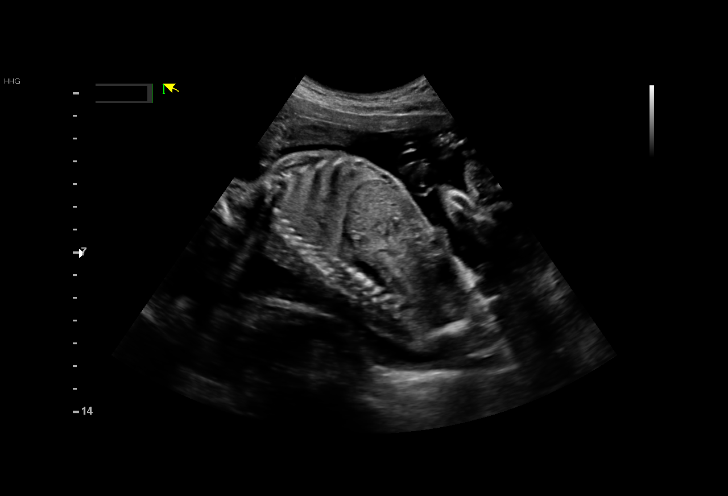
[im 3/32]
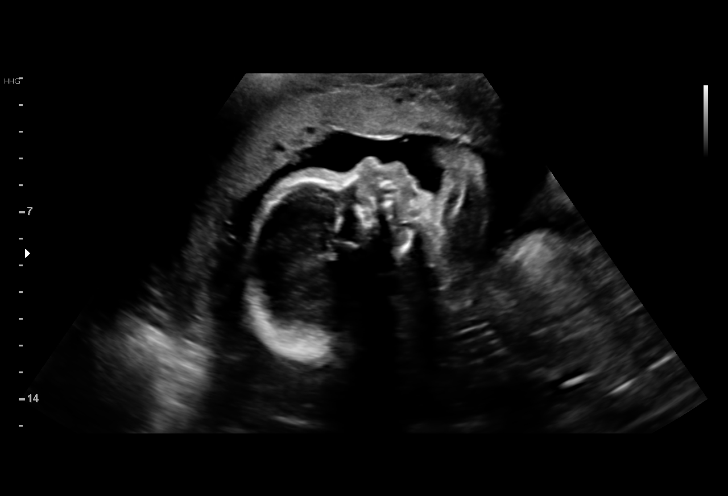
[im 5/32]
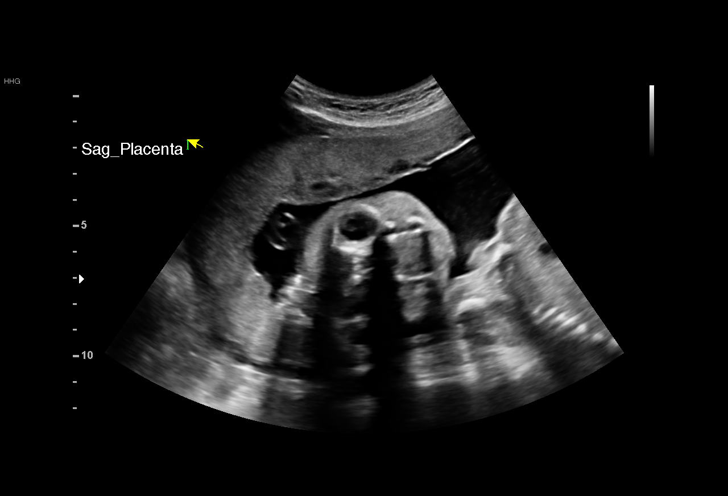
[im 7/32]
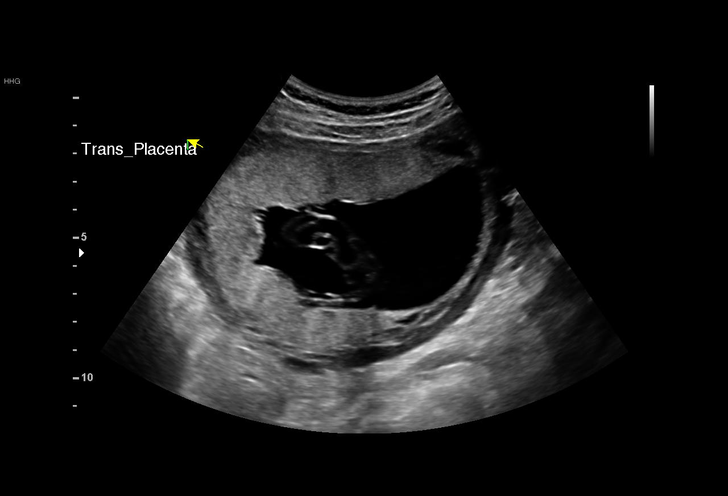
[im 10/32]
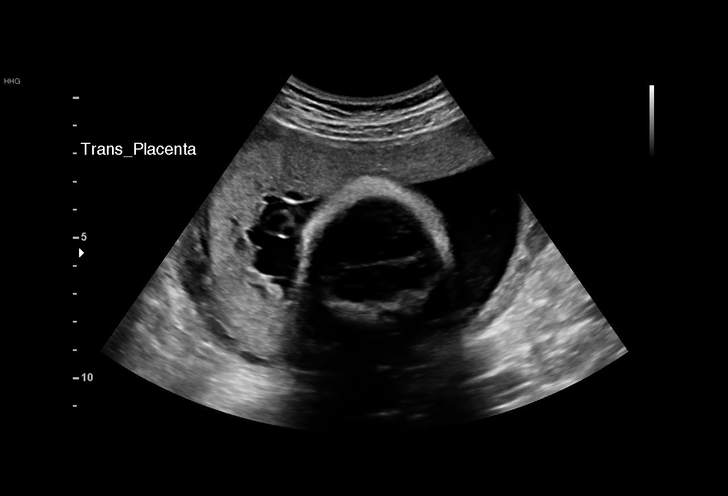
[im 12/32]
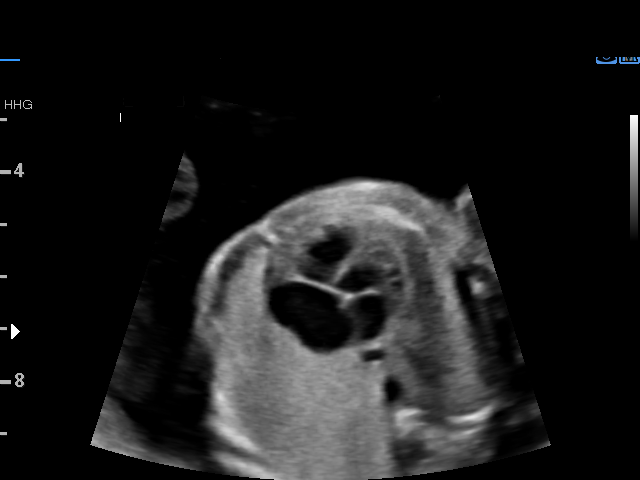
[im 14/32]
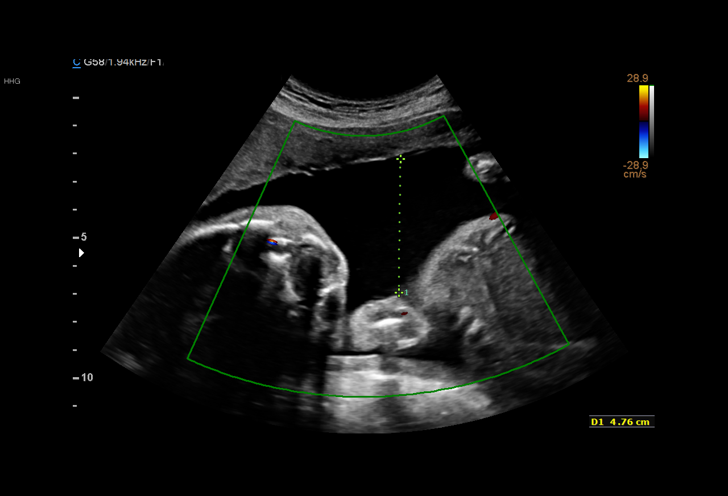
[im 17/32]
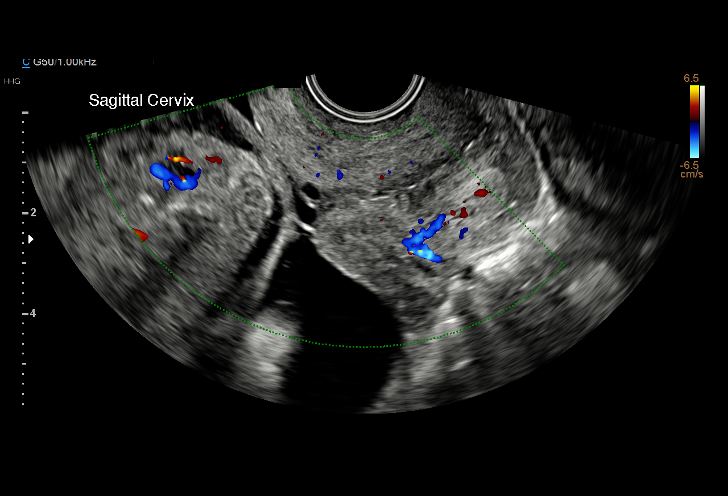
[im 18/32]
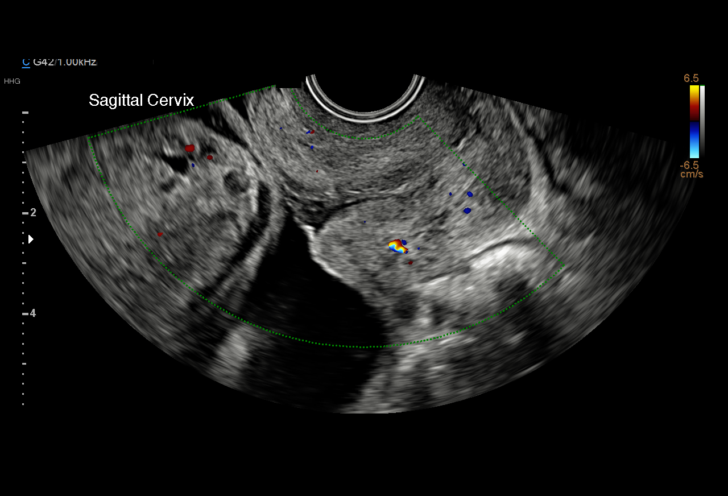
[im 20/32]
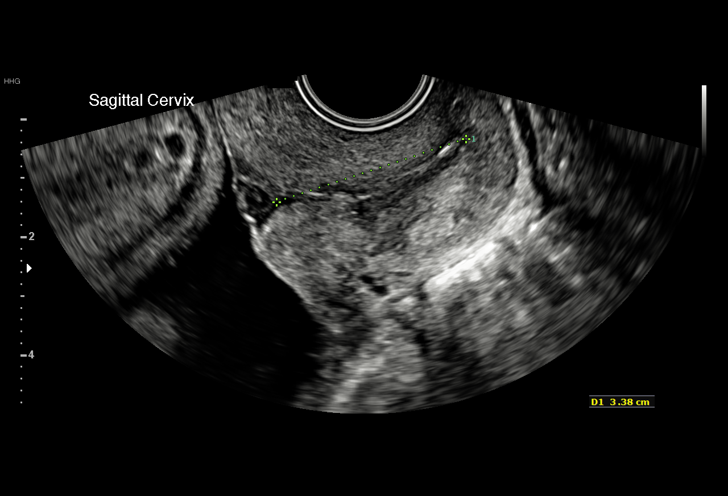
[im 22/32]
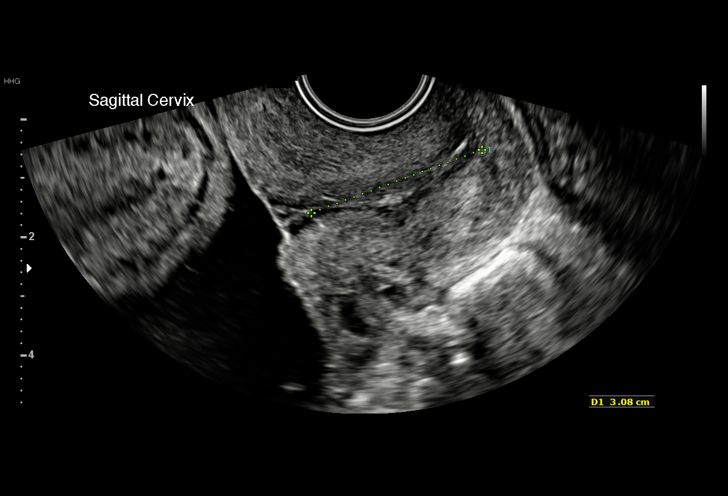
[im 25/32]
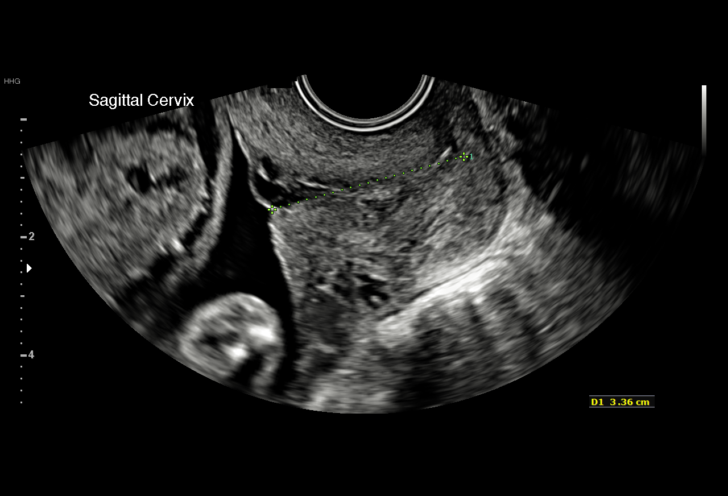
[im 27/32]
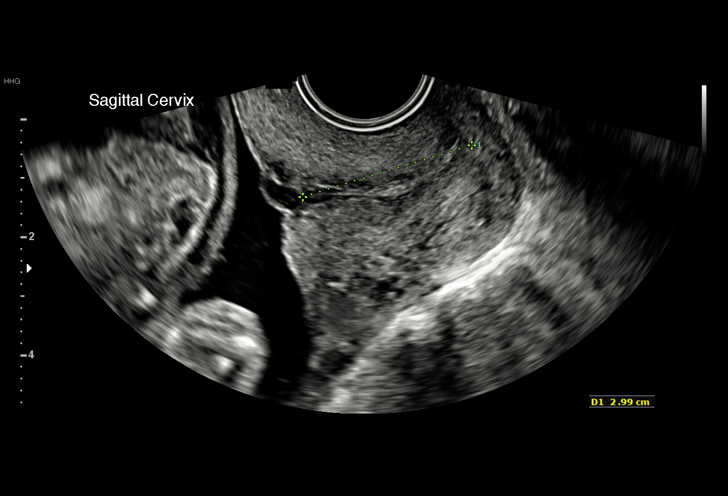
[im 29/32]
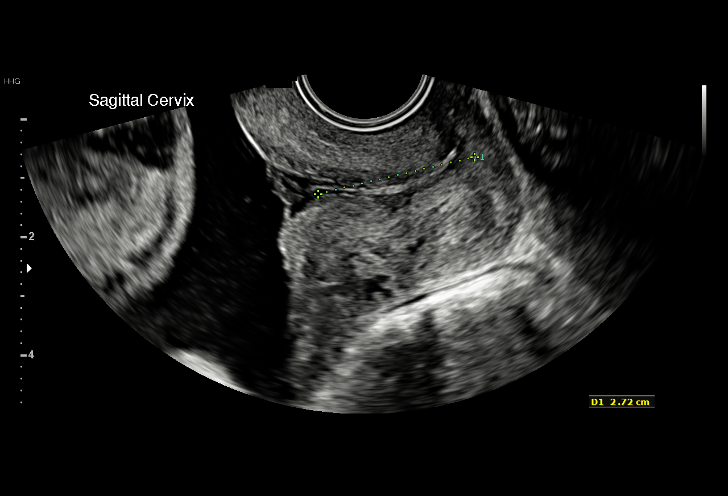
[im 32/32]
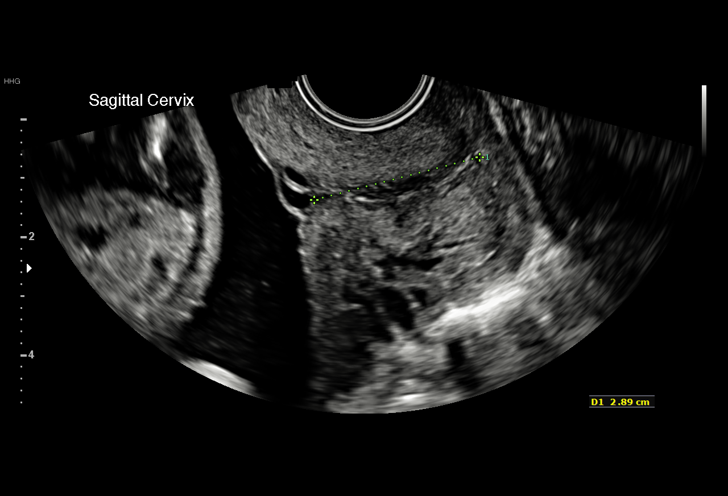

[15 of 28 positions shown; findings below may reference images not displayed]

OB/Gyn Clinic
08

1  OCIEL CHUANG           [id number]      [id number]     [id number]
Indications

25 weeks gestation of pregnancy
Bicornuate uterus, 2ND trimester; pregnancy
in left horn
Cervical shortening, second trimester;
vaginal progesterone
OB History

Gravidity:    1         Term:   0        Prem:   0        SAB:   0
TOP:          0       Ectopic:  0        Living: 0
Fetal Evaluation

Num Of Fetuses:     1
Fetal Heart         138
Rate(bpm):
Cardiac Activity:   Observed
Presentation:       Breech
Placenta:           Anterior Fundal, above cervical os
P. Cord Insertion:  Marginal insertion
Amniotic Fluid
AFI FV:      Subjectively within normal limits

Largest Pocket(cm)
4.8
Gestational Age

Best:          25w 2d     Det. By:  Early Ultrasound         EDD:   05/20/16
(10/06/15)
Cervix Uterus Adnexa

Cervix
Length:              2  cm.
Normal appearance by transvaginal scan
Impression

SIUP at 25+2 weeks
Normal amniotic fluid volume
EV views of cervix: dynamic minimal funneling with distal
closed portion measuring from 2.0 cms to 3.2 cms
Recommendations

Follow-up ultrasound for cervical length on [DATE]
Continue vaginal progesterone

## 2019-01-30 ENCOUNTER — Encounter: Payer: Self-pay | Admitting: Gynecology

## 2022-10-29 ENCOUNTER — Ambulatory Visit (HOSPITAL_COMMUNITY)
Admission: EM | Admit: 2022-10-29 | Discharge: 2022-10-29 | Disposition: A | Discharge status: Other Acute Inpatient Hospital | Payer: BC Managed Care – PPO | Attending: Family | Admitting: Family

## 2022-10-29 ENCOUNTER — Inpatient Hospital Stay (HOSPITAL_COMMUNITY)
Admission: AD | Admit: 2022-10-29 | Discharge: 2022-10-31 | DRG: 885 | Disposition: A | Discharge status: Home / Self Care | Payer: BC Managed Care – PPO | Source: Intra-hospital | Attending: Psychiatry | Admitting: Psychiatry

## 2022-10-29 DIAGNOSIS — F332 Major depressive disorder, recurrent severe without psychotic features: Principal | ICD-10-CM | POA: Diagnosis present

## 2022-10-29 DIAGNOSIS — R5383 Other fatigue: Secondary | ICD-10-CM | POA: Insufficient documentation

## 2022-10-29 DIAGNOSIS — Z825 Family history of asthma and other chronic lower respiratory diseases: Secondary | ICD-10-CM

## 2022-10-29 DIAGNOSIS — Z801 Family history of malignant neoplasm of trachea, bronchus and lung: Secondary | ICD-10-CM

## 2022-10-29 LAB — POCT URINE DRUG SCREEN - MANUAL ENTRY (I-SCREEN)
POC Amphetamine UR: NOT DETECTED
POC Buprenorphine (BUP): NOT DETECTED
POC Cocaine UR: NOT DETECTED
POC Marijuana UR: POSITIVE — AB
POC Methadone UR: NOT DETECTED
POC Methamphetamine UR: NOT DETECTED
POC Morphine: NOT DETECTED
POC Oxazepam (BZO): NOT DETECTED
POC Oxycodone UR: NOT DETECTED
POC Secobarbital (BAR): NOT DETECTED

## 2022-10-29 LAB — MAGNESIUM: Magnesium: 2.1 mg/dL (ref 1.7–2.4)

## 2022-10-29 LAB — COMPREHENSIVE METABOLIC PANEL
ALT: 15 U/L (ref 0–44)
AST: 16 U/L (ref 15–41)
Albumin: 4 g/dL (ref 3.5–5.0)
Alkaline Phosphatase: 45 U/L (ref 38–126)
Anion gap: 8 (ref 5–15)
BUN: 9 mg/dL (ref 6–20)
CO2: 25 mmol/L (ref 22–32)
Calcium: 9.1 mg/dL (ref 8.9–10.3)
Chloride: 102 mmol/L (ref 98–111)
Creatinine, Ser: 0.85 mg/dL (ref 0.44–1.00)
GFR, Estimated: >60 mL/min (ref >60)
Glucose, Bld: 75 mg/dL (ref 70–99)
Potassium: 4.3 mmol/L (ref 3.5–5.1)
Sodium: 135 mmol/L (ref 135–145)
Total Bilirubin: 0.9 mg/dL (ref 0.3–1.2)
Total Protein: 7.6 g/dL (ref 6.5–8.1)

## 2022-10-29 LAB — CBC WITH DIFFERENTIAL/PLATELET
Abs Immature Granulocytes: 0.01 10*3/uL (ref 0.00–0.07)
Basophils Absolute: 0 10*3/uL (ref 0.0–0.1)
Basophils Relative: 1 %
Eosinophils Absolute: 0 10*3/uL (ref 0.0–0.5)
Eosinophils Relative: 1 %
HCT: 40.8 % (ref 36.0–46.0)
Hemoglobin: 12.7 g/dL (ref 12.0–15.0)
Immature Granulocytes: 0 %
Lymphocytes Relative: 36 %
Lymphs Abs: 1.7 10*3/uL (ref 0.7–4.0)
MCH: 23 pg — ABNORMAL LOW (ref 26.0–34.0)
MCHC: 31.1 g/dL (ref 30.0–36.0)
MCV: 73.8 fL — ABNORMAL LOW (ref 80.0–100.0)
Monocytes Absolute: 0.2 10*3/uL (ref 0.1–1.0)
Monocytes Relative: 5 %
Neutro Abs: 2.7 10*3/uL (ref 1.7–7.7)
Neutrophils Relative %: 57 %
Platelets: 312 10*3/uL (ref 150–400)
RBC: 5.53 MIL/uL — ABNORMAL HIGH (ref 3.87–5.11)
RDW: 16 % — ABNORMAL HIGH (ref 11.5–15.5)
WBC: 4.7 10*3/uL (ref 4.0–10.5)
nRBC: 0 % (ref 0.0–0.2)

## 2022-10-29 LAB — TSH: TSH: 1.316 u[IU]/mL (ref 0.350–4.500)

## 2022-10-29 LAB — POCT PREGNANCY, URINE: Preg Test, Ur: NEGATIVE

## 2022-10-29 LAB — ETHANOL: Alcohol, Ethyl (B): <10 mg/dL (ref <10)

## 2022-10-29 MED ORDER — ACETAMINOPHEN 325 MG PO TABS: 650.0000 mg | ORAL_TABLET | Freq: Four times a day (QID) | ORAL | Status: DC | PRN

## 2022-10-29 MED ORDER — DIPHENHYDRAMINE HCL 25 MG PO CAPS: 50.0000 mg | ORAL_CAPSULE | Freq: Three times a day (TID) | ORAL | Status: DC | PRN

## 2022-10-29 MED ORDER — LORAZEPAM 1 MG PO TABS: 2.0000 mg | ORAL_TABLET | Freq: Three times a day (TID) | ORAL | Status: DC | PRN

## 2022-10-29 MED ORDER — SERTRALINE HCL 50 MG PO TABS: 50.0000 mg | ORAL_TABLET | Freq: Every day | ORAL | Status: DC

## 2022-10-29 MED ORDER — HYDROXYZINE HCL 25 MG PO TABS: 25.0000 mg | ORAL_TABLET | Freq: Three times a day (TID) | ORAL | Status: DC | PRN

## 2022-10-29 MED ORDER — MAGNESIUM HYDROXIDE 400 MG/5ML PO SUSP: 30.0000 mL | Freq: Every day | ORAL | Status: DC | PRN

## 2022-10-29 MED ORDER — LORAZEPAM 2 MG/ML IJ SOLN: 2.0000 mg | Freq: Three times a day (TID) | INTRAMUSCULAR | Status: DC | PRN

## 2022-10-29 MED ORDER — ALUM & MAG HYDROXIDE-SIMETH 200-200-20 MG/5ML PO SUSP: 30.0000 mL | ORAL | Status: DC | PRN

## 2022-10-29 MED ORDER — DIPHENHYDRAMINE HCL 50 MG/ML IJ SOLN: 50.0000 mg | Freq: Three times a day (TID) | INTRAMUSCULAR | Status: DC | PRN

## 2022-10-29 MED ORDER — TRAZODONE HCL 50 MG PO TABS: 50.0000 mg | ORAL_TABLET | Freq: Every evening | ORAL | Status: DC | PRN

## 2022-10-29 MED ORDER — SERTRALINE HCL 50 MG PO TABS
50.0000 mg | ORAL_TABLET | Freq: Every day | ORAL | Status: DC
  Filled 2022-10-29 (×3): qty 1

## 2022-10-29 MED ORDER — HALOPERIDOL LACTATE 5 MG/ML IJ SOLN: 5.0000 mg | Freq: Three times a day (TID) | INTRAMUSCULAR | Status: DC | PRN

## 2022-10-29 MED ORDER — HALOPERIDOL 5 MG PO TABS: 5.0000 mg | ORAL_TABLET | Freq: Three times a day (TID) | ORAL | Status: DC | PRN

## 2022-10-29 NOTE — ED Notes (Signed)
[facility] lab courier called to transport labs to [facility] lab 

## 2022-10-29 NOTE — ED Notes (Signed)
Notified night shift that patient has  a birthcontrol  pill in her bag in her locker

## 2022-10-29 NOTE — Progress Notes (Signed)
Pt was accepted to [facility] TODAY 10/29/2022; Bed Assignment 307-1 PENDING  CBC, CMET, EKG, UDS, UPREG, and signed voluntary consent faxed to [facility] [phone] or [phone]  DX: MDD   Pt meets inpatient criteria per Doran Heater, FNP  Attending Physician will be Dr. Enedina Finner  Report can be called to: --Adult unit: [phone]  Pt can arrive after: PENDING items [facility] will coordinate with care team during next shift. Please do not call report until PENDING items are completed and conformation is made by [facility].  Per [facility] preadmit please.  Care Team notified:Day [facility] Malva Limes, RN, Tina Allen,FNP, Roseanne Reno, RN, Durwin Reges, RN, [address] [place], [place] 10/29/2022 @ 6:06 PM

## 2022-10-29 NOTE — ED Notes (Signed)
Pt admitted to obs. Denies  SI/HI/AVH. Calm, cooperative throughout interview process. Skin assessment completed. Oriented to unit. Meal and drink offered. At currrent, pt continue to denies  SI/HI/AVH. Pt verbally contract for safety. Will monitor for safety. Patient has a birth control pill in the locker

## 2022-10-29 NOTE — Progress Notes (Signed)
   10/29/22 1615  [facility] Triage Screening (Walk-ins at [facility] only)  How Did You Hear About [place]? Self  What Is the Reason for Your Visit/Call Today? Pt presents to [facility] voluntarily seeking an evaluation due to worsening depression. Pt reports passive SI but no clear plan or intent. Pt states she cannot identify a trigger. Pt is interested in outpatient therapy and psychiatry.Pt denies HI and AVH.  How Long Has This Been Causing You Problems? <Week  Have You Recently Had Any Thoughts About Hurting Yourself? Yes  How long ago did you have thoughts about hurting yourself? today  Are You Planning to Commit Suicide/Harm Yourself At This time? No  Have you Recently Had Thoughts About Hurting Someone Karolee Ohs? No  Are You Planning To Harm Someone At This Time? No  Are you currently experiencing any auditory, visual or other hallucinations? No  Have You Used Any Alcohol or Drugs in the Past 24 Hours? No  Do you have any current medical co-morbidities that require immediate attention? No  Clinician description of patient physical appearance/behavior: tearful, cooperative  What Do You Feel Would Help You the Most Today? Treatment for Depression or other mood problem  If access to [facility] was not available, would you have sought care in the Emergency Department? No  Determination of Need Routine (7 days)  Options For Referral Outpatient Therapy;Medication Management

## 2022-10-29 NOTE — ED Provider Notes (Signed)
[facility] Continuous Assessment Admission H&P  Date: 10/29/22 Patient Name: Melody Mcclure MRN: [id number] Chief Complaint: depression  Diagnoses:  Final diagnoses:  MDD (major depressive disorder), recurrent severe, without psychosis [facility])    HPI: Patient presents voluntarily to [facility] behavioral health for walk-in assessment.   Patient is assessed, face-to-face, by nurse practitioner. She is seated in assessment area, no acute distress. Consulted with provider, Dr.  Lucianne Muss, and chart reviewed on 10/29/2022. She is alert and oriented, pleasant and cooperative during assessment.   Patient  presents with depressed mood, tearful affect.  She endorses worsening depressive symptoms x 1 month.  Symptoms include decreased sleep, decreased appetite, increased tearful episodes, feelings of helplessness and hopelessness.  She endorses fatigue and anhedonia.  She states "I have went for a walk or read a book but I cannot get out of bed to go for a walk or grab a book even."  Patient reports symptoms are worsening.    Patient states "I am sad and I am not sure why or how to stop it, it is getting worse."  Patient report chronic stressors include "I feel guilty because I do not enjoy time with my son lately.  I was not really ready to become a mom when I did.  I feel really bad about even saying that."  She  denies suicidal and homicidal ideations.  Patient reports she "has thought about different ways that people do it but I would not go through with that.  I would not kill myself because I know my son would be sad but that scares me to."  Unable to contract verbally for safety at this time.  Denies history of suicide attempts, denies history of non suicidal self-harm.     Patient has normal speech and behavior.  She  denies auditory and visual hallucinations.  Patient is able to converse coherently with goal-directed thoughts and no distractibility or preoccupation.  Denies symptoms of paranoia.   Objectively there is no evidence of psychosis/mania or delusional thinking.  Patient endorses history of postpartum depression.  Briefly followed by individual counseling 7 years ago.  No current medications.  No history of medications to address mood.  No history of inpatient psychiatric hospitalization.  Family mental health history includes patient's paternal aunt who has been diagnosed with bipolar disorder.  Serita resides in [place] with her 64-year-old son.  Son is currently with his father.  She denies access to weapons.  As a Engineer, site, currently on summer break.  She denies alcohol and substance use currently.  History of daily marijuana use stopped using marijuana 2 months ago.  Patient offered support and encouragement.  She agrees with plan for inpatient psychiatric admission.  Reviewed medications including sertraline, trazodone and hydroxyzine, discussed potential side effects and offered patient opportunity to ask questions.  She confirms desire to remain full CODE STATUS.   Total Time spent with patient: 30 minutes  Musculoskeletal  Strength & Muscle Tone: within normal limits Gait & Station: normal Patient leans: N/A  Psychiatric Specialty Exam  Presentation General Appearance:  Appropriate for Environment; Casual  Eye Contact: Fair  Speech: Clear and Coherent; Normal Rate  Speech Volume: Normal  Handedness: Right   Mood and Affect  Mood: Depressed; Worthless  Affect: Depressed   Thought Process  Thought Processes: Coherent; Goal Directed; Linear  Descriptions of Associations:Intact  Orientation:Full (Time, Place and Person)  Thought Content:Logical; WDL    Hallucinations:Hallucinations: None  Ideas of Reference:None  Suicidal Thoughts:Suicidal  Thoughts: No  Homicidal Thoughts:Homicidal Thoughts: No   Sensorium  Memory: Immediate Good; Recent Good  Judgment: Good  Insight: Fair   Executive Functions   Concentration: Good  Attention Span: Good  Recall: Good  Fund of Knowledge: Good  Language: Good   Psychomotor Activity  Psychomotor Activity: Psychomotor Activity: Normal   Assets  Assets: Communication Skills; Desire for Improvement; Financial Resources/Insurance; Housing; Physical Health; Resilience; Social Support   Sleep  Sleep: Sleep: Poor   Nutritional Assessment (For OBS and FBC admissions only) Has the patient had a weight loss or gain of 10 pounds or more in the last 3 months?: No Has the patient had a decrease in food intake/or appetite?: Yes Does the patient have dental problems?: No Does the patient have eating habits or behaviors that may be indicators of an eating disorder including binging or inducing vomiting?: No Has the patient recently lost weight without trying?: 0 Has the patient been eating poorly because of a decreased appetite?: 1 Malnutrition Screening Tool Score: 1    Physical Exam Vitals and nursing note reviewed.  Constitutional:      Appearance: Normal appearance. She is well-developed and normal weight.  HENT:     Head: Normocephalic and atraumatic.     Nose: Nose normal.  Cardiovascular:     Rate and Rhythm: Normal rate.  Pulmonary:     Effort: Pulmonary effort is normal.  Musculoskeletal:        General: Normal range of motion.     Cervical back: Normal range of motion.  Skin:    General: Skin is warm and dry.  Neurological:     Mental Status: She is alert and oriented to person, place, and time.  Psychiatric:        Attention and Perception: Attention and perception normal.        Mood and Affect: Mood is depressed. Affect is tearful.        Speech: Speech normal.        Behavior: Behavior normal. Behavior is cooperative.        Thought Content: Thought content normal.        Cognition and Memory: Cognition and memory normal.    Review of Systems  Constitutional: Negative.   HENT: Negative.    Eyes: Negative.    Respiratory: Negative.    Cardiovascular: Negative.   Gastrointestinal: Negative.   Genitourinary: Negative.   Musculoskeletal: Negative.   Skin: Negative.   Neurological: Negative.   Psychiatric/Behavioral:  Positive for depression. The patient has insomnia.     Blood pressure 117/88, pulse 99, temperature 98.8 F (37.1 C), temperature source Oral, resp. rate 18, SpO2 100 %, unknown if currently breastfeeding. There is no height or weight on file to calculate BMI.  Past Psychiatric History: per patient postpartum depression  Is the patient at risk to self? No  Has the patient been a risk to self in the past 6 months? No .    Has the patient been a risk to self within the distant past? No   Is the patient a risk to others? No   Has the patient been a risk to others in the past 6 months? No   Has the patient been a risk to others within the distant past? No   Past Medical History: cesarean section, anemia affecting pregnancy in third trimester, bicornate uterus  Family History: Paternal aunt- bipolar disorder  Social History: Employed, resides with family  Last Labs:  No visits with results within  6 Month(s) from this visit.  Latest known visit with results is:  Admission on 04/01/2016, Discharged on 04/10/2016  Component Date Value Ref Range Status   Color, Urine 04/01/2016 YELLOW  YELLOW Final   APPearance 04/01/2016 HAZY (A)  CLEAR Final   Specific Gravity, Urine 04/01/2016 1.015  1.005 - 1.030 Final   pH 04/01/2016 6.0  5.0 - 8.0 Final   Glucose, UA 04/01/2016 NEGATIVE  NEGATIVE mg/dL Final   Hgb urine dipstick 04/01/2016 TRACE (A)  NEGATIVE Final   Bilirubin Urine 04/01/2016 NEGATIVE  NEGATIVE Final   Ketones, ur 04/01/2016 NEGATIVE  NEGATIVE mg/dL Final   Protein, ur 05/13/7251 100 (A)  NEGATIVE mg/dL Final   Nitrite 66/44/0347 NEGATIVE  NEGATIVE Final   Leukocytes, UA 04/01/2016 NEGATIVE  NEGATIVE Final   Amnisure ROM 04/01/2016 POSITIVE   Final   POCT Fern Test  04/01/2016 Positive = ruptured amniotic membanes   Final   Chlamydia 04/01/2016 Negative   Final   Normal Reference Range - Negative   Neisseria Gonorrhea 04/01/2016 Negative   Final   Normal Reference Range - Negative   Yeast Wet Prep HPF POC 04/01/2016 NONE SEEN  NONE SEEN Final   Trich, Wet Prep 04/01/2016 NONE SEEN  NONE SEEN Final   Clue Cells Wet Prep HPF POC 04/01/2016 NONE SEEN  NONE SEEN Final   WBC, Wet Prep HPF POC 04/01/2016 NONE SEEN  NONE SEEN Final   Sperm 04/01/2016 NONE SEEN   Final   Squamous Epithelial / HPF 04/01/2016 6-30 (A)  NONE SEEN Final   WBC, UA 04/01/2016 0-5  0 - 5 WBC/hpf Final   RBC / HPF 04/01/2016 0-5  0 - 5 RBC/hpf Final   Bacteria, UA 04/01/2016 RARE (A)  NONE SEEN Final   ABO/RH(D) 04/01/2016 O POS   Final   Antibody Screen 04/01/2016 NEG   Final   Sample Expiration 04/01/2016 04/04/2016   Final   RPR Ser Ql 04/01/2016 Non Reactive  Non Reactive Final   Comment: (NOTE) Performed At: [facility] [address] [place], [place] [id number] Mila Homer MD FI:[id number]    WBC 04/01/2016 8.2  4.0 - 10.5 K/uL Final   RBC 04/01/2016 3.96  3.87 - 5.11 MIL/uL Final   Hemoglobin 04/01/2016 9.5 (L)  12.0 - 15.0 g/dL Final   HCT 16/60/6301 28.6 (L)  36.0 - 46.0 % Final   MCV 04/01/2016 72.2 (L)  78.0 - 100.0 fL Final   MCH 04/01/2016 24.0 (L)  26.0 - 34.0 pg Final   MCHC 04/01/2016 33.2  30.0 - 36.0 g/dL Final   RDW 60/02/9322 15.4  11.5 - 15.5 % Final   Platelets 04/01/2016 203  150 - 400 K/uL Final   Specimen Description 04/01/2016 VAGINAL/RECTAL   Final   Special Requests 04/01/2016 NONE   Final   Culture 04/01/2016    Final                   Value:NO GROUP B STREP (S.AGALACTIAE) ISOLATED Performed at [facility]    Report Status 04/01/2016 04/03/2016 FINAL   Final   HIV Screen 4th Generation wRfx 04/01/2016 Non Reactive  Non Reactive Final   Comment: (NOTE) Performed At: [facility] [address] [place], [place]  [id number] Mila Homer MD [place]:[id number]    ABO/RH(D) 04/04/2016 O POS   Final   Antibody Screen 04/04/2016 NEG   Final   Sample Expiration 04/04/2016 04/07/2016   Final   ABO/RH(D) 04/07/2016 O POS  Final   Antibody Screen 04/07/2016 NEG   Final   Sample Expiration 04/07/2016 04/10/2016   Final   WBC 04/08/2016 8.3  4.0 - 10.5 K/uL Final   RBC 04/08/2016 4.91  3.87 - 5.11 MIL/uL Final   Hemoglobin 04/08/2016 11.7 (L)  12.0 - 15.0 g/dL Final   HCT 16/02/9603 35.3 (L)  36.0 - 46.0 % Final   MCV 04/08/2016 71.9 (L)  78.0 - 100.0 fL Final   MCH 04/08/2016 23.8 (L)  26.0 - 34.0 pg Final   MCHC 04/08/2016 33.1  30.0 - 36.0 g/dL Final   RDW 54/01/8118 15.8 (H)  11.5 - 15.5 % Final   Platelets 04/08/2016 209  150 - 400 K/uL Final   WBC 04/09/2016 11.6 (H)  4.0 - 10.5 K/uL Final   RBC 04/09/2016 4.61  3.87 - 5.11 MIL/uL Final   Hemoglobin 04/09/2016 10.9 (L)  12.0 - 15.0 g/dL Final   HCT 14/78/2956 33.4 (L)  36.0 - 46.0 % Final   MCV 04/09/2016 72.5 (L)  78.0 - 100.0 fL Final   MCH 04/09/2016 23.6 (L)  26.0 - 34.0 pg Final   MCHC 04/09/2016 32.6  30.0 - 36.0 g/dL Final   RDW 21/30/8657 15.7 (H)  11.5 - 15.5 % Final   Platelets 04/09/2016 195  150 - 400 K/uL Final    Allergies: Dilaudid [hydromorphone hcl]  Medications:  PTA Medications  Medication Sig   Prenatal Vit-Fe Fumarate-FA (PRENATAL MULTIVITAMIN) TABS tablet Take 1 tablet by mouth at bedtime.   ferrous sulfate 325 (65 FE) MG tablet Take 1 tablet (325 mg total) by mouth 2 (two) times daily with a meal.   etonogestrel-ethinyl estradiol (NUVARING) 0.12-0.015 MG/24HR vaginal ring INSERT VAGINALLY AND LEAVE INPLACE FOR 3 CONSECUTIVE WEEKS, THEN REMOVE FOR 1 WEEK   Facility Ordered Medications  Medication   acetaminophen (TYLENOL) tablet 650 mg   alum & mag hydroxide-simeth (MAALOX/MYLANTA) 200-200-20 MG/5ML suspension 30 mL   magnesium hydroxide (MILK OF MAGNESIA) suspension 30 mL   hydrOXYzine (ATARAX) tablet 25 mg    traZODone (DESYREL) tablet 50 mg      Medical Decision Making  Patient remains voluntary.  She will be admitted to continuous assessment at [facility] behavioral health while awaiting inpatient psychiatric treatment.  Laboratory studies ordered including CBC, CMP, ethanol, magnesium, prolactin and TSH.  Urine pregnancy, urine drug screen ordered.  EKG order initiated.  Current medications: -Acetaminophen 650 mg every 6 as needed/mild pain -Maalox 30 mL oral every 4 as needed/digestion -Hydroxyzine 25 mg 3 times daily as needed/anxiety -Magnesium hydroxide 30 mL daily as needed/mild constipation -Trazodone 50 mg nightly as needed/sleep -Sertraline 50 mg daily/mood     Recommendations  Based on my evaluation the patient does not appear to have an emergency medical condition.  Lenard Lance, FNP 10/29/22  5:33 PM

## 2022-10-29 NOTE — ED Notes (Signed)
While speaking with patient she became tearful and requesting to go home, she feels uncomfortable in the urgent care setting. This nurse offered to move the patient over to the flex area since it is more secure, the patient refused. NP Channing Mutters was made aware. He came over and spoke with patient. Patient was advised that it is best for her to go over to [facility] where she will have a room of her own and more privacy. The patient still felt it would be best for her to go home. However at this time the patient per NP will be transferred over to [facility]. Report has been called.

## 2022-10-30 ENCOUNTER — Encounter (HOSPITAL_COMMUNITY): Payer: Self-pay | Admitting: Family

## 2022-10-30 ENCOUNTER — Encounter (HOSPITAL_COMMUNITY): Payer: Self-pay

## 2022-10-30 ENCOUNTER — Other Ambulatory Visit: Payer: Self-pay

## 2022-10-30 DIAGNOSIS — F332 Major depressive disorder, recurrent severe without psychotic features: Secondary | ICD-10-CM | POA: Diagnosis not present

## 2022-10-30 MED ORDER — NORETHIN ACE-ETH ESTRAD-FE 1-20 MG-MCG PO TABS
1.0000 | ORAL_TABLET | Freq: Every day | ORAL | Status: DC
  Administered 2022-10-30: 1 via ORAL

## 2022-10-30 NOTE — Plan of Care (Signed)
?  Problem: Education: ?Goal: Knowledge of [place] [facility] Education information/materials will improve ?Outcome: Progressing ?Goal: Emotional status will improve ?Outcome: Progressing ?Goal: Mental status will improve ?Outcome: Progressing ?Goal: Verbalization of understanding the information provided will improve ?Outcome: Progressing ?  ?Problem: Activity: ?Goal: Interest or engagement in activities will improve ?Outcome: Progressing ?Goal: Sleeping patterns will improve ?Outcome: Progressing ?  ?Problem: Coping: ?Goal: Ability to verbalize frustrations and anger appropriately will improve ?Outcome: Progressing ?Goal: Ability to demonstrate self-control will improve ?Outcome: Progressing ?  ?Problem: Health Behavior/Discharge Planning: ?Goal: Identification of resources available to assist in meeting health care needs will improve ?Outcome: Progressing ?Goal: Compliance with treatment plan for underlying cause of condition will improve ?Outcome: Progressing ?  ?Problem: Physical Regulation: ?Goal: Ability to maintain clinical measurements within normal limits will improve ?Outcome: Progressing ?  ?Problem: Safety: ?Goal: Periods of time without injury will increase ?Outcome: Progressing ?  ?Problem: Education: ?Goal: Ability to state activities that reduce stress will improve ?Outcome: Progressing ?  ?Problem: Coping: ?Goal: Ability to identify and develop effective coping behavior will improve ?Outcome: Progressing ?  ?Problem: Self-Concept: ?Goal: Ability to identify factors that promote anxiety will improve ?Outcome: Progressing ?Goal: Level of anxiety will decrease ?Outcome: Progressing ?Goal: Ability to modify response to factors that promote anxiety will improve ?Outcome: Progressing ?  ?Problem: Education: ?Goal: Utilization of techniques to improve thought processes will improve ?Outcome: Progressing ?Goal: Knowledge of the prescribed therapeutic regimen will improve ?Outcome: Progressing ?  ?Problem:  Activity: ?Goal: Interest or engagement in leisure activities will improve ?Outcome: Progressing ?Goal: Imbalance in normal sleep/wake cycle will improve ?Outcome: Progressing ?  ?Problem: Coping: ?Goal: Coping ability will improve ?Outcome: Progressing ?Goal: Will verbalize feelings ?Outcome: Progressing ?  ?Problem: Health Behavior/Discharge Planning: ?Goal: Ability to make decisions will improve ?Outcome: Progressing ?Goal: Compliance with therapeutic regimen will improve ?Outcome: Progressing ?  ?Problem: Role Relationship: ?Goal: Will demonstrate positive changes in social behaviors and relationships ?Outcome: Progressing ?  ?Problem: Safety: ?Goal: Ability to disclose and discuss suicidal ideas will improve ?Outcome: Progressing ?Goal: Ability to identify and utilize support systems that promote safety will improve ?Outcome: Progressing ?  ?Problem: Self-Concept: ?Goal: Will verbalize positive feelings about self ?Outcome: Progressing ?Goal: Level of anxiety will decrease ?Outcome: Progressing ?  ?

## 2022-10-30 NOTE — BH IP Treatment Plan (Signed)
Interdisciplinary Treatment and Diagnostic Plan   10/30/2022 Time of Session: 1115 Melody Mcclure MRN: [id number]  Principal Diagnosis: MDD (major depressive disorder), recurrent severe, without psychosis (HCC)  Secondary Diagnoses: Principal Problem:   MDD (major depressive disorder), recurrent severe, without psychosis (HCC)   Current Medications:  Current Facility-Administered Medications  Medication Dose Route Frequency Provider Last Rate Last Admin   acetaminophen (TYLENOL) tablet 650 mg  650 mg Oral Q6H PRN Lenard Lance, FNP       alum & mag hydroxide-simeth (MAALOX/MYLANTA) 200-200-20 MG/5ML suspension 30 mL  30 mL Oral Q4H PRN Lenard Lance, FNP       diphenhydrAMINE (BENADRYL) capsule 50 mg  50 mg Oral TID PRN Lenard Lance, FNP       Or   diphenhydrAMINE (BENADRYL) injection 50 mg  50 mg Intramuscular TID PRN Lenard Lance, FNP       haloperidol (HALDOL) tablet 5 mg  5 mg Oral TID PRN Lenard Lance, FNP       Or   haloperidol lactate (HALDOL) injection 5 mg  5 mg Intramuscular TID PRN Lenard Lance, FNP       hydrOXYzine (ATARAX) tablet 25 mg  25 mg Oral TID PRN Lenard Lance, FNP       LORazepam (ATIVAN) tablet 2 mg  2 mg Oral TID PRN Lenard Lance, FNP       Or   LORazepam (ATIVAN) injection 2 mg  2 mg Intramuscular TID PRN Lenard Lance, FNP       magnesium hydroxide (MILK OF MAGNESIA) suspension 30 mL  30 mL Oral Daily PRN Lenard Lance, FNP       sertraline (ZOLOFT) tablet 50 mg  50 mg Oral Daily Lenard Lance, FNP       traZODone (DESYREL) tablet 50 mg  50 mg Oral QHS PRN Lenard Lance, FNP       PTA Medications: Medications Prior to Admission  Medication Sig Dispense Refill Last Dose   norethindrone-ethinyl estradiol-FE (LOESTRIN FE) 1-20 MG-MCG tablet Take 1 tablet by mouth daily.   10/28/2022    Patient Stressors: Educational concerns   Financial difficulties   Occupational concerns    Patient Strengths: Average or above average intelligence  Capable of  independent living  Communication skills  Supportive family/friends   Treatment Modalities: Medication Management, Group therapy, Case management,  1 to 1 session with clinician, Psychoeducation, Recreational therapy.   Physician Treatment Plan for Primary Diagnosis: MDD (major depressive disorder), recurrent severe, without psychosis (HCC) Long Term Goal(s):     Short Term Goals:    Medication Management: Evaluate patient's response, side effects, and tolerance of medication regimen.  Therapeutic Interventions: 1 to 1 sessions, Unit Group sessions and Medication administration.  Evaluation of Outcomes: Progressing  Physician Treatment Plan for Secondary Diagnosis: Principal Problem:   MDD (major depressive disorder), recurrent severe, without psychosis (HCC)  Long Term Goal(s):     Short Term Goals:       Medication Management: Evaluate patient's response, side effects, and tolerance of medication regimen.  Therapeutic Interventions: 1 to 1 sessions, Unit Group sessions and Medication administration.  Evaluation of Outcomes: Progressing   RN Treatment Plan for Primary Diagnosis: MDD (major depressive disorder), recurrent severe, without psychosis (HCC) Long Term Goal(s): Knowledge of disease and therapeutic regimen to maintain health will improve  Short Term Goals: Ability to remain free from injury will improve, Ability to verbalize frustration and anger appropriately will  improve, Ability to demonstrate self-control, Ability to participate in decision making will improve, Ability to verbalize feelings will improve, Ability to disclose and discuss suicidal ideas, Ability to identify and develop effective coping behaviors will improve, and Compliance with prescribed medications will improve  Medication Management: RN will administer medications as ordered by provider, will assess and evaluate patient's response and provide education to patient for prescribed medication. RN will  report any adverse and/or side effects to prescribing provider.  Therapeutic Interventions: 1 on 1 counseling sessions, Psychoeducation, Medication administration, Evaluate responses to treatment, Monitor vital signs and CBGs as ordered, Perform/monitor CIWA, COWS, AIMS and Fall Risk screenings as ordered, Perform wound care treatments as ordered.  Evaluation of Outcomes: Progressing   LCSW Treatment Plan for Primary Diagnosis: MDD (major depressive disorder), recurrent severe, without psychosis (HCC) Long Term Goal(s): Safe transition to appropriate next level of care at discharge, Engage patient in therapeutic group addressing interpersonal concerns.  Short Term Goals: Engage patient in aftercare planning with referrals and resources, Increase social support, Increase ability to appropriately verbalize feelings, Increase emotional regulation, Facilitate acceptance of mental health diagnosis and concerns, Facilitate patient progression through stages of change regarding substance use diagnoses and concerns, Identify triggers associated with mental health/substance abuse issues, and Increase skills for wellness and recovery  Therapeutic Interventions: Assess for all discharge needs, 1 to 1 time with Social worker, Explore available resources and support systems, Assess for adequacy in community support network, Educate family and significant other(s) on suicide prevention, Complete Psychosocial Assessment, Interpersonal group therapy.  Evaluation of Outcomes: Progressing   Progress in Treatment: Attending groups: Yes. Participating in groups: Yes. Taking medication as prescribed: No. Toleration medication: No. Family/Significant other contact made: [name] Capp [phone] (Mom) Patient understands diagnosis: Yes. Discussing patient identified problems/goals with staff: Yes. Medical problems stabilized or resolved: Yes. Denies suicidal/homicidal ideation: Yes. Issues/concerns per patient  self-inventory: Yes. Other: N/A  New problem(s) identified: No, Describe:  None Known  New Short Term/Long Term Goal(s): medication stabilization, elimination of SI thoughts, development of comprehensive mental wellness plan.   Patient Goals:  Coping Skills  Discharge Plan or Barriers: Patient recently admitted. CSW will continue to follow and assess for appropriate referrals and possible discharge planning.   Reason for Continuation of Hospitalization: Anxiety Depression  Estimated Length of Stay: 2-5 Days  Last 3 [place] Suicide Severity Risk Score: Flowsheet Row [facility] Most recent reading at 10/30/2022  1:00 AM ED from 10/29/2022 in [facility] Most recent reading at 10/29/2022  6:50 PM  C-SSRS RISK CATEGORY Low Risk Low Risk       Last PHQ 2/9 Scores:    05/20/2016    2:45 PM 03/19/2016    2:05 PM 03/05/2016    3:33 PM  Depression screen PHQ 2/9  Decreased Interest 3 1 0  Down, Depressed, Hopeless 1 1 1   PHQ - 2 Score 4 2 1   Altered sleeping 0 1 1  Tired, decreased energy 0 1 1  Change in appetite 1 3 2   Feeling bad or failure about yourself  0 1 1  Trouble concentrating 0 0 0  Moving slowly or fidgety/restless 0 0 0  Suicidal thoughts 0 0 0  PHQ-9 Score 5 8 6      medication stabilization, elimination of SI thoughts, development of comprehensive mental wellness plan.   Scribe for Treatment Team: Ane Payment, LCSW 10/30/2022 1:24 PM

## 2022-10-30 NOTE — [facility] Suicide Risk Assessment (Signed)
[facility] INPATIENT:  Family/Significant Other Suicide Prevention Education  Suicide Prevention Education:  Education Completed; 10-30-2022, [name] [phone] (Mom) has been identified by the patient as the family member/significant other with whom the patient will be residing, and identified as the person(s) who will aid the patient in the event of a mental health crisis (suicidal ideations/suicide attempt).  With written consent from the patient, the family member/significant other has been provided the following suicide prevention education, prior to the and/or following the discharge of the patient.  The suicide prevention education provided includes the following: Suicide risk factors Suicide prevention and interventions National Suicide Hotline telephone number [facility] assessment telephone number [facility] Emergency Assistance 911 [facility] and/or Residential Mobile Crisis Unit telephone number  Request made of family/significant other to: Remove weapons (e.g., guns, rifles, knives), all items previously/currently identified as safety concern.   Remove drugs/medications (over-the-counter, prescriptions, illicit drugs), all items previously/currently identified as a safety concern.  [name] [phone] (Mom) verbalizes understanding of the suicide prevention education information provided.  The family member/significant other agrees to remove the items of safety concern listed above.  Melody Mcclure 10/30/2022, 1:17 PM
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
## 2022-10-30 NOTE — [facility] Suicide Risk Assessment (Signed)
[facility] Admission Suicide Risk Assessment   Nursing information obtained from:    Demographic factors:  NA Current Mental Status:  NA Loss Factors:  NA Historical Factors:  NA Risk Reduction Factors:  NA  Total Time spent with patient: 30 minutes Principal Problem: MDD (major depressive disorder), recurrent severe, without psychosis (HCC) Diagnosis:  Principal Problem:   MDD (major depressive disorder), recurrent severe, without psychosis (HCC)  Subjective Data: The patient is a 32 year old African-American female from [place] who was admitted with depression and suicidal ideations.  Continued Clinical Symptoms:  Alcohol Use Disorder Identification Test Final Score (AUDIT): 0 The "Alcohol Use Disorders Identification Test", Guidelines for Use in Primary Care, Second Edition.  World Science writer [facility]). Score between 0-7:  no or low risk or alcohol related problems. Score between 8-15:  moderate risk of alcohol related problems. Score between 16-19:  high risk of alcohol related problems. Score 20 or above:  warrants further diagnostic evaluation for alcohol dependence and treatment.   CLINICAL FACTORS:   Depression:   Anhedonia Insomnia   Musculoskeletal: Strength & Muscle Tone: within normal limits Gait & Station: normal Patient leans: N/A  Psychiatric Specialty Exam:  Presentation  General Appearance:  Appropriate for Environment; Casual  Eye Contact: Fair  Speech: Clear and Coherent; Normal Rate  Speech Volume: Normal  Handedness: Right   Mood and Affect  Mood: Depressed; Worthless  Affect: Depressed   Thought Process  Thought Processes: Coherent; Goal Directed; Linear  Descriptions of Associations:Intact  Orientation:Full (Time, Place and Person)  Thought Content:Logical; WDL  History of Schizophrenia/Schizoaffective disorder:No data recorded Duration of Psychotic Symptoms:No data recorded Hallucinations:Hallucinations: None  Ideas  of Reference:None  Suicidal Thoughts:Suicidal Thoughts: No  Homicidal Thoughts:Homicidal Thoughts: No   Sensorium  Memory: Immediate Good; Recent Good  Judgment: Good  Insight: Fair   Art therapist  Concentration: Good  Attention Span: Good  Recall: Good  Fund of Knowledge: Good  Language: Good   Psychomotor Activity  Psychomotor Activity: Psychomotor Activity: Normal   Assets  Assets: Communication Skills; Desire for Improvement; Financial Resources/Insurance; Housing; Physical Health; Resilience; Social Support   Sleep  Sleep: Sleep: Poor    Physical Exam: Physical Exam Vitals and nursing note reviewed.  Constitutional:      Appearance: Normal appearance.  Neurological:     Mental Status: She is alert.    Review of Systems  Psychiatric/Behavioral:  Positive for depression and suicidal ideas.   All other systems reviewed and are negative.  Blood pressure 110/83, pulse 71, temperature 98.4 F (36.9 C), temperature source Oral, resp. rate 18, height 5\' 8"  (1.727 m), weight 59.7 kg, SpO2 91 %, unknown if currently breastfeeding. Body mass index is 20.01 kg/m.   COGNITIVE FEATURES THAT CONTRIBUTE TO RISK:  Thought constriction (tunnel vision)    SUICIDE RISK:   Moderate:  Frequent suicidal ideation with limited intensity, and duration, some specificity in terms of plans, no associated intent, good self-control, limited dysphoria/symptomatology, some risk factors present, and identifiable protective factors, including available and accessible social support.  PLAN OF CARE: Patient is admitted to the safe and secure environment.  We will continue to closely monitor her symptoms and assess safety.  Patient has signed a 72-hour request for discharge.  I certify that inpatient services furnished can reasonably be expected to improve the patient's condition.   Rex Kras, MD 10/30/2022, 1:59 PM

## 2022-10-30 NOTE — Progress Notes (Signed)
   Patient  admitted to [facility] for MDD. Pt presents with depressed mood, tearful affect.  She endorses worsening depressive symptoms x 1 month.  Symptoms include decreased sleep, decreased appetite, increased tearful episodes, feelings of helplessness and hopelessness.  She endorses fatigue and anhedonia.  She states "I have went for a walk or read a book but I cannot get out of bed to go for a walk or grab a book even."  Patient reports symptoms are worsening. Patient states "I am sad and I am not sure why or how to stop it, it is getting worse."  Patient report chronic stressors include "I feel guilty because I do not enjoy time with my son lately.  I was not really ready to become a mom when I did.  I feel really bad about even saying that." She  denies suicidal and homicidal ideations.  Patient reports she "has thought about different ways that people do it but I would not go through with that.  I would not kill myself because I know my son would be sad but that scares me to."  Unable to contract verbally for safety at this time.  Denies history of suicide attempts, denies history of non suicidal self-harm.   Patient has normal speech and behavior.  She  denies auditory and visual hallucinations.  Patient is able to converse coherently with goal-directed thoughts and no distractibility or preoccupation.  Denies symptoms of paranoia.  Objectively there is no evidence of psychosis/mania or delusional thinking. Patient is tearful that she is not able to go home. Patient talked to the [facility] and was given an option of signing 72 hr form. Skin assessment completed, pt oriented to the unit, now in bed appears asleep. Will continue to monitor and update as needed.

## 2022-10-30 NOTE — Progress Notes (Signed)
Patient deneis SI, HI and AVH. Rates depression and anxiety both a 7 or 8 out of 10. Patient is tearful and wants to be discharged. Patient was sitting alone in the dark this morning staring into space. Patient states she had worsening depression and just wanted to get outpatient therapy. Patient encouraged to attend group. Observed to be interacting in the milieu. Patient refused morning dose of zoloft and PRN for anxiety. Patient remains safe on the unit. Q 15 minute safety checks ongoing.   10/30/22 0900  Psych Admission Type (Psych Patients Only)  Admission Status Voluntary/72 hour document signed  Psychosocial Assessment  Patient Complaints Sadness;Depression;Loneliness;Isolation;Hopelessness;Irritability  Eye Contact Fair  Facial Expression Sad  Affect Depressed;Sad;Preoccupied  Speech Soft;Logical/coherent  Interaction Assertive  Motor Activity  (WDL)  Appearance/Hygiene Unremarkable  Behavior Characteristics Cooperative  Mood Sad;Depressed  Thought Process  Coherency WDL  Content WDL  Delusions None reported or observed  Perception WDL  Hallucination None reported or observed  Judgment WDL  Confusion None  Danger to Self  Current suicidal ideation? Denies  Self-Injurious Behavior No self-injurious ideation or behavior indicators observed or expressed   Agreement Not to Harm Self Yes  Description of Agreement verbal  Danger to Others  Danger to Others None reported or observed

## 2022-10-30 NOTE — Group Note (Signed)
Date:  10/30/2022 Time:  11:50 AM  Group Topic/Focus:  Goals Group:   The focus of this group is to help patients establish daily goals to achieve during treatment and discuss how the patient can incorporate goal setting into their daily lives to aide in recovery.    Participation Level:  Did Not Attend  Participation Quality:      Affect:      Cognitive:      Insight: None  Engagement in Group:    Modes of Intervention:      Additional Comments:     Reymundo Poll 10/30/2022, 11:50 AM

## 2022-10-30 NOTE — [facility] Group Notes (Signed)
Adult Psychoeducational Group Note  Date:  10/30/2022 Time:  9:27 PM  Group Topic/Focus:  Wrap-Up Group:   The focus of this group is to help patients review their daily goal of treatment and discuss progress on daily workbooks.  Participation Level:  Active  Participation Quality:  Appropriate  Affect:  Appropriate  Cognitive:  Appropriate  Insight: Appropriate  Engagement in Group:  Engaged  Modes of Intervention:  Discussion  Additional Comments:  Pt attended the evening AA group.  Christ Kick 10/30/2022, 9:27 PM

## 2022-10-30 NOTE — Group Note (Signed)
Recreation Therapy Group Note   Group Topic:Team Building  Group Date: 10/30/2022 Start Time: 0930 End Time: 1005 Facilitators: Marsha Gundlach-McCall, LRT,CTRS Location: [address] Dayroom   Goal Area(s) Addresses:  Patient will effectively work with peer towards shared goal.  Patient will identify skills used to make activity successful.  Patient will identify how skills used during activity can be used to reach post d/c goals.    Group Description: Straw Bridge. In teams of 3-5, patients were given 12 plastic drinking straws and an equal length of masking tape. Using the materials provided, patients were instructed to build a free standing bridge-like structure to suspend an everyday item (ex: puzzle box) off of the floor or table surface. All materials were required to be used by the team in their design. LRT facilitated post-activity discussion reviewing team process. Patients were encouraged to reflect how the skills used in this activity can be generalized to daily life post discharge.    Affect/Mood: Appropriate   Participation Level: Engaged   Participation Quality: Independent   Behavior: Appropriate   Speech/Thought Process: Focused   Insight: Good   Judgement: Good   Modes of Intervention: STEM Activity   Patient Response to Interventions:  Engaged   Education Outcome:  Acknowledges education   Clinical Observations/Individualized Feedback: Pt attended and participated in group session.     Plan: Continue to engage patient in RT group sessions 2-3x/week.   Eloyse Causey-McCall, LRT,CTRS 10/30/2022 11:46 AM

## 2022-10-30 NOTE — Tx Team (Signed)
Initial Treatment Plan 10/30/2022 4:02 AM Melody Mcclure ZOX:[id number]    PATIENT STRESSORS: Educational concerns   Financial difficulties   Occupational concerns     PATIENT STRENGTHS: Average or above average intelligence  Capable of independent living  Communication skills  Supportive family/friends    PATIENT IDENTIFIED PROBLEMS: anxiety  depression  Parenting issues                 DISCHARGE CRITERIA:  Motivation to continue treatment in a less acute level of care Reduction of life-threatening or endangering symptoms to within safe limits Verbal commitment to aftercare and medication compliance  PRELIMINARY DISCHARGE PLAN: Return to previous living arrangement Return to previous work or school arrangements  PATIENT/FAMILY INVOLVEMENT: This treatment plan has been presented to and reviewed with the patient, Melody Mcclure. The patient has been given the opportunity to ask questions and make suggestions.  Lahoma Crocker, RN 10/30/2022, 4:02 AM

## 2022-10-30 NOTE — Plan of Care (Signed)
?  Problem: Education: ?Goal: Knowledge of [facility] information/materials will improve ?Outcome: Progressing ?Goal: Emotional status will improve ?Outcome: Progressing ?Goal: Mental status will improve ?Outcome: Progressing ?Goal: Verbalization of understanding the information provided will improve ?Outcome: Progressing ?  ?Problem: Activity: ?Goal: Interest or engagement in activities will improve ?Outcome: Progressing ?Goal: Sleeping patterns will improve ?Outcome: Progressing ?  ?Problem: Coping: ?Goal: Ability to verbalize frustrations and anger appropriately will improve ?Outcome: Progressing ?Goal: Ability to demonstrate self-control will improve ?Outcome: Progressing ?  ?Problem: Health Behavior/Discharge Planning: ?Goal: Identification of resources available to assist in meeting health care needs will improve ?Outcome: Progressing ?Goal: Compliance with treatment plan for underlying cause of condition will improve ?Outcome: Progressing ?  ?Problem: Physical Regulation: ?Goal: Ability to maintain clinical measurements within normal limits will improve ?Outcome: Progressing ?  ?Problem: Safety: ?Goal: Periods of time without injury will increase ?Outcome: Progressing ?  ?Problem: Education: ?Goal: Ability to state activities that reduce stress will improve ?Outcome: Progressing ?  ?Problem: Coping: ?Goal: Ability to identify and develop effective coping behavior will improve ?Outcome: Progressing ?  ?Problem: Self-Concept: ?Goal: Ability to identify factors that promote anxiety will improve ?Outcome: Progressing ?Goal: Level of anxiety will decrease ?Outcome: Progressing ?Goal: Ability to modify response to factors that promote anxiety will improve ?Outcome: Progressing ?  ?Problem: Education: ?Goal: Utilization of techniques to improve thought processes will improve ?Outcome: Progressing ?Goal: Knowledge of the prescribed therapeutic regimen will improve ?Outcome: Progressing ?  ?Problem:  Activity: ?Goal: Interest or engagement in leisure activities will improve ?Outcome: Progressing ?Goal: Imbalance in normal sleep/wake cycle will improve ?Outcome: Progressing ?  ?Problem: Coping: ?Goal: Coping ability will improve ?Outcome: Progressing ?Goal: Will verbalize feelings ?Outcome: Progressing ?  ?Problem: Health Behavior/Discharge Planning: ?Goal: Ability to make decisions will improve ?Outcome: Progressing ?Goal: Compliance with therapeutic regimen will improve ?Outcome: Progressing ?  ?Problem: Role Relationship: ?Goal: Will demonstrate positive changes in social behaviors and relationships ?Outcome: Progressing ?  ?Problem: Safety: ?Goal: Ability to disclose and discuss suicidal ideas will improve ?Outcome: Progressing ?Goal: Ability to identify and utilize support systems that promote safety will improve ?Outcome: Progressing ?  ?Problem: Self-Concept: ?Goal: Will verbalize positive feelings about self ?Outcome: Progressing ?Goal: Level of anxiety will decrease ?Outcome: Progressing ?  ?

## 2022-10-30 NOTE — H&P (Signed)
Psychiatric Admission Assessment Adult  Patient Identification: Melody Mcclure MRN:  [id number] Date of Evaluation:  10/30/2022 Chief Complaint:  MDD (major depressive disorder), recurrent severe, without psychosis (HCC) [F33.2] Principal Diagnosis: MDD (major depressive disorder), recurrent severe, without psychosis (HCC) Diagnosis:  Principal Problem:   MDD (major depressive disorder), recurrent severe, without psychosis (HCC) Identifying information and reason for admission: The patient is a 32 year old African-American female who was seen and evaluated in the [facility] behavioral health as a walk-in.  She was seen for depression and increased crying episodes.  She was admitted because she was unable to contract verbally for safety. History of Present Illness: Most information was obtained from the records and the patient interview.  According to the records the patient was very depressed and tearful and has been endorsing worsening symptoms of depression over the last month which includes decreased sleep and decreased appetite and increased tearfulness and feelings of helplessness and hopelessness.  She also had significant anhedonia.  She had been feeling sad to the point she has had persistent feelings of guilt but had thoughts of different ways that people can hurt hands themselves and she claims that she would not kill herself because she knew her son would be sad but she is scared to do something.  At the same time she is unable to contract verbally for safety.  She denied any prior history of suicide attempts.  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  She has had counseling.  She has not been on any medication.  On examination today: The patient was seen and evaluated and chart was reviewed.  She also attended treatment team.  Patient was quite distraught about being here and she feels that she is not suicidal and this is not the right environment for her.  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  She also goes to graduate school and is doing her master's in special education.  Her son does visit his father periodically but for the most part she takes care of him and goes to school and does a full-time job.  She feels overwhelmed.  She does not feel that she has adequate support from her family because none of them are in [place].  However she reports that she is not suicidal and she does not want any antidepressants. She is requesting discharge and is hoping that she can have a safety plan prior to discharge. Associated Signs/Symptoms: Depression Symptoms:  anhedonia, psychomotor retardation, anxiety, disturbed sleep, (Hypo) Manic Symptoms:  Labiality of Mood, Anxiety Symptoms:  Social Anxiety, Psychotic Symptoms:   Denies any. PTSD Symptoms: Negative Total Time spent with patient: 30 minutes  Past Psychiatric History: Patient gives a history of postpartum depression.  She had individual counseling 7 years ago.  No history of medications.  Is the patient at risk to self? No.  Has the patient been a risk to self in the past 6 months? No.  Has the patient been a risk to self within the distant past? No.  Is the patient a risk to others? No.  Has the patient been a risk to others in the past 6 months? No.  Has the patient been a risk to others within the distant past? No.   [place] Scale:  Flowsheet Row [facility] ([facility] Most recent reading at 10/30/2022  1:00 AM ED from 10/29/2022 in [facility] Most  recent reading at 10/29/2022  6:50 PM  C-SSRS RISK CATEGORY Low Risk Low Risk        Prior Inpatient Therapy: No. If yes, describe no been hospitalized. Prior Outpatient Therapy: Yes.   If yes, describe 7 years ago when she had postpartum depression.  Alcohol Screening: 1. How often do you have a drink containing alcohol?:  Never 2. How many drinks containing alcohol do you have on a typical day when you are drinking?: 1 or 2 3. How often do you have six or more drinks on one occasion?: Never AUDIT-C Score: 0 4. How often during the last year have you found that you were not able to stop drinking once you had started?: Never 5. How often during the last year have you failed to do what was normally expected from you because of drinking?: Never 6. How often during the last year have you needed a first drink in the morning to get yourself going after a heavy drinking session?: Never 7. How often during the last year have you had a feeling of guilt of remorse after drinking?: Never 8. How often during the last year have you been unable to remember what happened the night before because you had been drinking?: Never 9. Have you or someone else been injured as a result of your drinking?: No 10. Has a relative or friend or a doctor or another health worker been concerned about your drinking or suggested you cut down?: No Alcohol Use Disorder Identification Test Final Score (AUDIT): 0 Alcohol Brief Interventions/Follow-up: Alcohol education/Brief advice Substance Abuse History in the last 12 months:  No. 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   History of syncope    last episode 2014   Palpitations    Pyelonephritis    SOB (shortness of breath)     Past Surgical History:  Procedure Laterality Date   CESAREAN SECTION N/A 04/08/2016   Procedure: CESAREAN SECTION;  Surgeon: Tereso Newcomer, MD;  Location: [facility];  Service: Obstetrics;  Laterality: N/A;   NO PAST SURGERIES     Family History:  Family History  Problem Relation Age of Onset   Asthma Brother    Cancer Paternal Grandfather        Lung cancer   COPD Paternal Uncle    Family  Psychiatric  History: Unknown at this time Tobacco Screening:  Social History   Tobacco Use  Smoking Status Never  Smokeless Tobacco Never    BH Tobacco Counseling     Are you interested in Tobacco Cessation Medications?  No value filed. Counseled patient on smoking cessation:  No value filed. Reason Tobacco Screening Not Completed: No value filed.       Social History:  Social History   Substance and Sexual Activity  Alcohol Use No   Comment: on occasion     Social History   Substance and Sexual Activity  Drug Use Yes   Types: Marijuana   Comment: FORMER [employer]JUINA    Additional Social History:                           Allergies:   Allergies  Allergen Reactions   Dilaudid [Hydromorphone Hcl] Itching   Lab Results:  Results for orders placed or performed during the hospital encounter of 10/29/22 (from the past 48  hour(s))  CBC with Differential/Platelet     Status: Abnormal   Collection Time: 10/29/22  6:31 PM  Result Value Ref Range   WBC 4.7 4.0 - 10.5 K/uL   RBC 5.53 (H) 3.87 - 5.11 MIL/uL   Hemoglobin 12.7 12.0 - 15.0 g/dL   HCT 24.4 01.0 - 27.2 %   MCV 73.8 (L) 80.0 - 100.0 fL   MCH 23.0 (L) 26.0 - 34.0 pg   MCHC 31.1 30.0 - 36.0 g/dL   RDW 53.6 (H) 64.4 - 03.4 %   Platelets 312 150 - 400 K/uL   nRBC 0.0 0.0 - 0.2 %   Neutrophils Relative % 57 %   Neutro Abs 2.7 1.7 - 7.7 K/uL   Lymphocytes Relative 36 %   Lymphs Abs 1.7 0.7 - 4.0 K/uL   Monocytes Relative 5 %   Monocytes Absolute 0.2 0.1 - 1.0 K/uL   Eosinophils Relative 1 %   Eosinophils Absolute 0.0 0.0 - 0.5 K/uL   Basophils Relative 1 %   Basophils Absolute 0.0 0.0 - 0.1 K/uL   Immature Granulocytes 0 %   Abs Immature Granulocytes 0.01 0.00 - 0.07 K/uL    Comment: Performed at [facility], [address] [place], [place] [address]  Comprehensive metabolic panel     Status: None   Collection Time: 10/29/22  6:31 PM  Result Value Ref Range   Sodium 135 135 - 145 mmol/L    Potassium 4.3 3.5 - 5.1 mmol/L   Chloride 102 98 - 111 mmol/L   CO2 25 22 - 32 mmol/L   Glucose, Bld 75 70 - 99 mg/dL    Comment: Glucose reference range applies only to samples taken after fasting for at least 8 hours.   BUN 9 6 - 20 mg/dL   Creatinine, Ser 5.63 0.44 - 1.00 mg/dL   Calcium 9.1 8.9 - 87.5 mg/dL   Total Protein 7.6 6.5 - 8.1 g/dL   Albumin 4.0 3.5 - 5.0 g/dL   AST 16 15 - 41 U/L   ALT 15 0 - 44 U/L   Alkaline Phosphatase 45 38 - 126 U/L   Total Bilirubin 0.9 0.3 - 1.2 mg/dL   GFR, Estimated >64 >33 mL/min    Comment: (NOTE) Calculated using the CKD-EPI Creatinine Equation (2021)    Anion gap 8 5 - 15    Comment: Performed at [facility], [address], [place], [place] [address]  Magnesium     Status: None   Collection Time: 10/29/22  6:31 PM  Result Value Ref Range   Magnesium 2.1 1.7 - 2.4 mg/dL    Comment: Performed at [facility], [address] [place], [place] [address]  Ethanol     Status: None   Collection Time: 10/29/22  6:31 PM  Result Value Ref Range   Alcohol, Ethyl (B) <10 <10 mg/dL    Comment: (NOTE) Lowest detectable limit for serum alcohol is 10 mg/dL.  For medical purposes only. Performed at [facility], [address] [place], [place] [address]   TSH     Status: None   Collection Time: 10/29/22  6:31 PM  Result Value Ref Range   TSH 1.316 0.350 - 4.500 uIU/mL    Comment: Performed by a 3rd Generation assay with a functional sensitivity of <=0.01 uIU/mL. Performed at [facility], [address], [place], [place] [address]   POCT Urine Drug Screen - (I-Screen)  Status: Abnormal   Collection Time: 10/29/22  6:31 PM  Result Value Ref Range   POC Amphetamine UR None Detected NONE DETECTED (Cut Off Level 1000 ng/mL)   POC Secobarbital (BAR) None Detected NONE DETECTED (Cut Off Level 300 ng/mL)   POC Buprenorphine (BUP) None Detected NONE DETECTED (Cut Off Level 10 ng/mL)   POC Oxazepam (BZO) None Detected  NONE DETECTED (Cut Off Level 300 ng/mL)   POC Cocaine UR None Detected NONE DETECTED (Cut Off Level 300 ng/mL)   POC Methamphetamine UR None Detected NONE DETECTED (Cut Off Level 1000 ng/mL)   POC Morphine None Detected NONE DETECTED (Cut Off Level 300 ng/mL)   POC Methadone UR None Detected NONE DETECTED (Cut Off Level 300 ng/mL)   POC Oxycodone UR None Detected NONE DETECTED (Cut Off Level 100 ng/mL)   POC Marijuana UR Positive (A) NONE DETECTED (Cut Off Level 50 ng/mL)  Pregnancy, urine POC     Status: None   Collection Time: 10/29/22  6:35 PM  Result Value Ref Range   Preg Test, Ur NEGATIVE NEGATIVE    Comment:        THE SENSITIVITY OF THIS METHODOLOGY IS >24 mIU/mL     Blood Alcohol level:  Lab Results  Component Value Date   ETH <10 10/29/2022    Metabolic Disorder Labs:  No results found for: "HGBA1C", "MPG" No results found for: "PROLACTIN" No results found for: "CHOL", "TRIG", "HDL", "CHOLHDL", "VLDL", "LDLCALC"  Current Medications: Current Facility-Administered Medications  Medication Dose Route Frequency Provider Last Rate Last Admin   acetaminophen (TYLENOL) tablet 650 mg  650 mg Oral Q6H PRN Lenard Lance, FNP       alum & mag hydroxide-simeth (MAALOX/MYLANTA) 200-200-20 MG/5ML suspension 30 mL  30 mL Oral Q4H PRN Lenard Lance, FNP       diphenhydrAMINE (BENADRYL) capsule 50 mg  50 mg Oral TID PRN Lenard Lance, FNP       Or   diphenhydrAMINE (BENADRYL) injection 50 mg  50 mg Intramuscular TID PRN Lenard Lance, FNP       haloperidol (HALDOL) tablet 5 mg  5 mg Oral TID PRN Lenard Lance, FNP       Or   haloperidol lactate (HALDOL) injection 5 mg  5 mg Intramuscular TID PRN Lenard Lance, FNP       hydrOXYzine (ATARAX) tablet 25 mg  25 mg Oral TID PRN Lenard Lance, FNP       LORazepam (ATIVAN) tablet 2 mg  2 mg Oral TID PRN Lenard Lance, FNP       Or   LORazepam (ATIVAN) injection 2 mg  2 mg Intramuscular TID PRN Lenard Lance, FNP       magnesium hydroxide  (MILK OF MAGNESIA) suspension 30 mL  30 mL Oral Daily PRN Lenard Lance, FNP       sertraline (ZOLOFT) tablet 50 mg  50 mg Oral Daily Lenard Lance, FNP       traZODone (DESYREL) tablet 50 mg  50 mg Oral QHS PRN Lenard Lance, FNP       PTA Medications: Medications Prior to Admission  Medication Sig Dispense Refill Last Dose   norethindrone-ethinyl estradiol-FE (LOESTRIN FE) 1-20 MG-MCG tablet Take 1 tablet by mouth daily.   10/28/2022    Musculoskeletal: Strength & Muscle Tone: within normal limits Gait & Station: normal Patient leans: N/A            Psychiatric  Specialty Exam:  Presentation  General Appearance:  Casual  Eye Contact: Fair  Speech: Clear and Coherent  Speech Volume: Decreased  Handedness: Right   Mood and Affect  Mood: Anxious; Depressed  Affect: Tearful   Thought Process  Thought Processes: Coherent  Duration of Psychotic Symptoms:N/A Past Diagnosis of Schizophrenia or Psychoactive disorder: No data recorded Descriptions of Associations:Intact  Orientation:Full (Time, Place and Person)  Thought Content:Logical  Hallucinations:Hallucinations: None  Ideas of Reference:None  Suicidal Thoughts:Suicidal Thoughts: No  Homicidal Thoughts:Homicidal Thoughts: No   Sensorium  Memory: Immediate Fair; Remote Fair; Recent Fair  Judgment: Fair  Insight: Good   Executive Functions  Concentration: Fair  Attention Span: Good  Recall: Fair  Fund of Knowledge: Fair  Language: Fair   Psychomotor Activity  Psychomotor Activity: Psychomotor Activity: Normal   Assets  Assets: Communication Skills; Desire for Improvement; Housing   Sleep  Sleep: Sleep: Fair    Physical Exam: Physical Exam Constitutional:      Appearance: Normal appearance.  Neurological:     Mental Status: She is alert.  Psychiatric:        Mood and Affect: Mood normal.    Review of Systems  Psychiatric/Behavioral:  Positive for  depression.   All other systems reviewed and are negative.  Blood pressure 110/83, pulse 71, temperature 98.4 F (36.9 C), temperature source Oral, resp. rate 18, height 5\' 8"  (1.727 m), weight 59.7 kg, SpO2 91 %, unknown if currently breastfeeding. Body mass index is 20.01 kg/m.  Treatment Plan Summary: Daily contact with patient to assess and evaluate symptoms and progress in treatment and Medication management  Observation Level/Precautions:  15 minute checks  Laboratory:   As indicated.  Psychotherapy: Individual and group.  Medications: Patient refuses medications.  Consultations:    Discharge Concerns: Safety plan.  Estimated LOS: 1 to 3 days.  Other:     Physician Treatment Plan for Primary Diagnosis: MDD (major depressive disorder), recurrent severe, without psychosis (HCC) Long Term Goal(s): Improvement in symptoms so as ready for discharge  Short Term Goals: Ability to disclose and discuss suicidal ideas and Ability to identify and develop effective coping behaviors will improve  Physician Treatment Plan for Secondary Diagnosis: Principal Problem:   MDD (major depressive disorder), recurrent severe, without psychosis (HCC)  Long Term Goal(s): Improvement in symptoms so as ready for discharge  Short Term Goals: Ability to verbalize feelings will improve, Ability to identify and develop effective coping behaviors will improve, and Ability to identify triggers associated with substance abuse/mental health issues will improve  I certify that inpatient services furnished can reasonably be expected to improve the patient's condition.    Rex Kras, MD 6/21/20242:03 PM

## 2022-10-31 NOTE — Hospital Course (Addendum)
Melody Mcclure is a 32 year old African-American female w/ hx of postpartum depression 6 years ago who was seen and evaluated in the [facility] behavioral health as a walk-in. She was seen for depression and increased crying episodes. She was admitted because she was unable to verbally contract for safety.   Patient was able to contract for safety while at behavioral health hospital. While Zoloft was recommended for treatment on admission, patient was uncomfortable with starting any medications at this time.  Patient is interested in attending therapy but not interested in medication management at this time.  She denies SI/HI/AVH throughout hospitalization. Patient's mother was contacted for collateral and will be regularly checking in on patient and denied any safety concerns.

## 2022-10-31 NOTE — Plan of Care (Signed)
Collateral Call Spoke with patient's mother William Hamburger @ [phone] Mother did not have any specific safety concerns with regards to patient's safety.  Patient does not have any stockpiled medications nor weapons in the house.  Mother plans to support patient throughout this process.  Mother checks in with patient on a daily basis already and sees her physically 3 times per week.  Mother is agreeable for patient to discharge today and had no other questions at this time.  Park Pope, MD PGY2 Psychiatry Resident

## 2022-10-31 NOTE — [facility] Group Notes (Signed)
[facility] Group Notes:  (Nursing/MHT/Case Management/Adjunct)  Date:  10/31/2022  Time:  9:37 AM  Type of Therapy:  Group Therapy  Participation Level:  Active  Participation Quality:  Appropriate  Affect:  Appropriate  Cognitive:  Appropriate  Insight:  Good  Engagement in Group:  Engaged  Modes of Intervention:  Discussion, Orientation, and Socialization  Summary of Progress/Problems: Discharging today  Azalee Course 10/31/2022, 9:37 AM

## 2022-10-31 NOTE — Progress Notes (Signed)
Pt refuses to put anything other than "N/A" for each question on suicide safety plan. Discussed importance of completing suicide safety plan accurately and importance of having plan in place with pt. Pt refused teaching.

## 2022-10-31 NOTE — Progress Notes (Signed)
  [facility] Adult Case Management Discharge Plan :  Will you be returning to the same living situation after discharge:  Yes,  own home At discharge, do you have transportation home?: Yes,  vehicle is at [facility], safe transport will take from [facility] to [facility] Do you have the ability to pay for your medications: Yes,  BCBS  Release of information consent forms completed and in the chart;  Patient's signature needed at discharge.  Patient to Follow up at:  Follow-up Information     [facility] Follow up on 11/06/2022.   Why: You have a hospital follow up appointment on 11/06/22 at 1:30 pm to obtain therapy and medication management services.  This appointment will be Virtual via telehealth. Contact information: [address]  [address] [place] [address] [phone]                 Next level of care provider has access to [facility] Link:no  Safety Planning and Suicide Prevention discussed: Yes,  with mother     Has patient been referred to the Quitline?: Patient does not use tobacco/nicotine products  Patient has been referred for addiction treatment: Pt declines.  Lorri Frederick, LCSW 10/31/2022, 2:02 PM

## 2022-10-31 NOTE — [facility] Suicide Risk Assessment (Signed)
[facility] INPATIENT:  Family/Significant Other Suicide Prevention Education  Suicide Prevention Education:  Education Completed; Melody Mcclure, mother, [phone], has been identified by the patient as the family member/significant other with whom the patient will be residing, and identified as the person(s) who will aid the patient in the event of a mental health crisis (suicidal ideations/suicide attempt).  With written consent from the patient, the family member/significant other has been provided the following suicide prevention education, prior to the and/or following the discharge of the patient.  The suicide prevention education provided includes the following: Suicide risk factors Suicide prevention and interventions National Suicide Hotline telephone number [facility] assessment telephone number [facility] Emergency Assistance 911 [facility] and/or Residential Mobile Crisis Unit telephone number  Request made of family/significant other to: Remove weapons (e.g., guns, rifles, knives), all items previously/currently identified as safety concern.  Melody Mcclure reports pt does not have guns to her knowledge.  [name] does have a gun but keeps it locked. Remove drugs/medications (over-the-counter, prescriptions, illicit drugs), all items previously/currently identified as a safety concern.  The family member/significant other verbalizes understanding of the suicide prevention education information provided.  The family member/significant other agrees to remove the items of safety concern listed above.  Melody Mcclure reports she sees pt 3x week, was not aware that she was struggling with depression.  Pt son is currently staying with his father.  She is agreeable to be a support moving forward, hopes that pt will agree to stay with her after DC temporarily.  Lorri Frederick 10/31/2022, 10:54 AM

## 2022-10-31 NOTE — Plan of Care (Signed)
  Problem: Education: Goal: Knowledge of [facility] Education information/materials will improve Outcome: Progressing   Problem: Education: Goal: Mental status will improve Outcome: Progressing   Problem: Coping: Goal: Ability to verbalize frustrations and anger appropriately will improve Outcome: Progressing   Problem: Safety: Goal: Periods of time without injury will increase Outcome: Progressing   Problem: Education: Goal: Ability to state activities that reduce stress will improve Outcome: Progressing

## 2022-10-31 NOTE — Progress Notes (Signed)
   10/31/22 0844  Psych Admission Type (Psych Patients Only)  Admission Status Voluntary/72 hour document signed  Psychosocial Assessment  Patient Complaints Irritability  Eye Contact Fair  Facial Expression Flat  Affect Irritable  Speech Argumentative  Interaction Assertive  Motor Activity Other (Comment) (WDL)  Appearance/Hygiene Unremarkable  Behavior Characteristics Agitated  Mood Irritable  Thought Process  Coherency WDL  Content WDL  Delusions None reported or observed  Perception WDL  Hallucination None reported or observed  Judgment Poor  Confusion None  Danger to Self  Current suicidal ideation? Denies  Self-Injurious Behavior No self-injurious ideation or behavior indicators observed or expressed   Agreement Not to Harm Self Yes  Description of Agreement verbal  Danger to Others  Danger to Others None reported or observed

## 2022-10-31 NOTE — Progress Notes (Signed)
Pt discharged at approximately 2:39pm. Pt alert and oriented x 4, calm and cooperative at time of discharge. Discharge paperwork reviewed with pt and all belongings returned to pt. Pt verbalized understanding of discharge instructions.

## 2022-10-31 NOTE — Discharge Summary (Signed)
Physician Discharge Summary Note  Patient:  Melody Mcclure is an 32 y.o., female MRN:  [id number] DOB:  [birth year] Patient phone:  [phone] (home)  Patient address:   [address] [place] [address],  Total Time spent with patient: 45 minutes  Date of Admission:  10/29/2022 Date of Discharge: 10/31/2022  Melody Mcclure is a 32 year old African-American female w/ hx of postpartum depression 6 years ago who was seen and evaluated in the [facility] behavioral health as a walk-in. She was seen for depression and increased crying episodes. She was admitted because she was unable to verbally contract for safety.   Patient was able to contract for safety while at behavioral health hospital. While Zoloft was recommended for treatment on admission, patient was uncomfortable with starting any medications at this time.  Patient is interested in attending therapy but not interested in medication management at this time.  She denies SI/HI/AVH throughout hospitalization. Patient's mother was contacted for collateral and will be regularly checking in on patient and denied any safety concerns.   Principal Problem: MDD (major depressive disorder), recurrent severe, without psychosis (HCC) Discharge Diagnoses: Principal Problem:   MDD (major depressive disorder), recurrent severe, without psychosis (HCC)    Past Psychiatric History:  Patient gives a history of postpartum depression. She had individual counseling 7 years ago. No history of medications. She had been smoking cannabis for management of anxiety but stopped regular use 2-3 months prior but did admit to one time use about 2 weeks ago.  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   History of syncope    last episode 2014   Palpitations    Pyelonephritis    SOB (shortness of breath)     Past Surgical History:  Procedure Laterality Date   CESAREAN SECTION N/A 04/08/2016    Procedure: CESAREAN SECTION;  Surgeon: Tereso Newcomer, MD;  Location: [facility];  Service: Obstetrics;  Laterality: N/A;   NO PAST SURGERIES     Family History:  Family History  Problem Relation Age of Onset   Asthma Brother    Cancer Paternal Grandfather        Lung cancer   COPD Paternal Uncle    Social History:  Social History   Substance and Sexual Activity  Alcohol Use No   Comment: on occasion     Social History   Substance and Sexual Activity  Drug Use Yes   Types: Marijuana   Comment: FORMER MARJUINA    Social History   Socioeconomic History   Marital status: Unknown    Spouse name: Not on file   Number of children: Not on file   Years of education: Not on file   Highest education level: Not on file  Occupational History   Not on file  Tobacco Use   Smoking status: Never   Smokeless tobacco: Never  Substance and Sexual Activity   Alcohol use: No    Comment: on occasion   Drug use: Yes    Types: Marijuana    Comment: FORMER MARJUINA   Sexual activity: Yes    Comment: INTERCOURSE AGE 9, SEXUAL PARTNERS LESS GTHAN 5  Other Topics Concern   Not on file  Social History Narrative   ** Merged History Encounter **       Social Determinants of Health   Financial Resource Strain: Not on file  Food Insecurity: No Food Insecurity (10/30/2022)   Hunger  Vital Sign    Worried About Programme researcher, broadcasting/film/video in the Last Year: Never true    Ran Out of Food in the Last Year: Never true  Transportation Needs: No Transportation Needs (10/30/2022)   PRAPARE - Administrator, Civil Service (Medical): No    Lack of Transportation (Non-Medical): No  Physical Activity: Not on file  Stress: Not on file  Social Connections: Not on file     Physical Findings:  Musculoskeletal: Strength & Muscle Tone: within normal limits Gait & Station: normal Patient leans: N/A   Psychiatric Specialty Exam:  Presentation  General Appearance:   Casual   Eye Contact: Fair   Speech: Clear and Coherent   Speech Volume: Decreased   Handedness: Right    Mood and Affect  Mood: Anxious; Depressed   Affect: Tearful    Thought Process  Thought Processes: Coherent   Descriptions of Associations:Intact   Orientation:Full (Time, Place and Person)   Thought Content:Logical   History of Schizophrenia/Schizoaffective disorder:No data recorded  Duration of Psychotic Symptoms:No data recorded  Hallucinations:Hallucinations: None   Ideas of Reference:None   Suicidal Thoughts:Suicidal Thoughts: No   Homicidal Thoughts:Homicidal Thoughts: No    Sensorium  Memory: Immediate Fair; Remote Fair; Recent Fair   Judgment: Fair   Insight: Good    Executive Functions  Concentration: Fair   Attention Span: Good   Recall: Fair   Fund of Knowledge: Fair   Language: Fair    Psychomotor Activity  Psychomotor Activity: Psychomotor Activity: Normal    Assets  Assets: Communication Skills; Desire for Improvement; Housing    Sleep  Sleep: Sleep: Fair     Physical Exam: Physical Exam ROS Blood pressure 99/77, pulse (!) 104, temperature 97.7 F (36.5 C), resp. rate 18, height 5\' 8"  (1.727 m), weight 59.7 kg, SpO2 100 %, unknown if currently breastfeeding. Body mass index is 20.01 kg/m.   Social History   Tobacco Use  Smoking Status Never  Smokeless Tobacco Never   Tobacco Cessation:  N/A, patient does not currently use tobacco products   Blood Alcohol level:  Lab Results  Component Value Date   ETH <10 10/29/2022    Metabolic Disorder Labs:  No results found for: "HGBA1C", "MPG" No results found for: "PROLACTIN" No results found for: "CHOL", "TRIG", "HDL", "CHOLHDL", "VLDL", "LDLCALC"  See Psychiatric Specialty Exam and Suicide Risk Assessment completed by Attending Physician prior to discharge.  Discharge destination:  Home  Is patient on multiple  antipsychotic therapies at discharge:  No   Has Patient had three or more failed trials of antipsychotic monotherapy by history:  No  Recommended Plan for Multiple Antipsychotic Therapies: NA  Discharge Instructions     Diet - low sodium heart healthy   Complete by: As directed    Increase activity slowly   Complete by: As directed       Allergies as of 10/31/2022       Reactions   Dilaudid [hydromorphone Hcl] Itching        Medication List     TAKE these medications      Indication  norethindrone-ethinyl estradiol-FE 1-20 MG-MCG tablet Commonly known as: LOESTRIN FE Take 1 tablet by mouth daily.  Indication: Birth Control Treatment        Follow-up Information     [facility] Follow up on 11/06/2022.   Why: You have a hospital follow up appointment on 11/06/22 at 1:30 pm to obtain therapy and medication management services.  This appointment will  be Virtual via telehealth. Contact information: [address]  [address] [place] [address] [phone]                  Follow-up recommendations:   Activity:  as tolerated Diet:  heart healthy   Comments:  Prescriptions were given at discharge.  Patient is agreeable with the discharge plan.  Patient was given an opportunity to ask questions.  Patient appears to feel comfortable with discharge and denies any current suicidal or homicidal thoughts.    Patient is instructed prior to discharge to: Take all medications as prescribed by mental healthcare provider. Report any adverse effects and or reactions from the medicines to outpatient provider promptly. In the event of worsening symptoms, patient is instructed to call the crisis hotline, 911 and or go to the nearest ED for appropriate evaluation and treatment of symptoms. Patient is to follow-up with primary care provider for other medical issues, concerns and or health care needs.   Signed: Park Pope, MD 10/31/2022, 1:02 PM

## 2022-10-31 NOTE — BHH Suicide Risk Assessment (Signed)
[facility] Discharge Suicide Risk Assessment   Principal Problem: MDD (major depressive disorder), recurrent severe, without psychosis (HCC) Discharge Diagnoses: Principal Problem:   MDD (major depressive disorder), recurrent severe, without psychosis (HCC)   Melody Mcclure is a 32 year old African-American female w/ hx of postpartum depression 6 years ago who was seen and evaluated in the [facility] behavioral health as a walk-in. She was seen for depression and increased crying episodes. She was admitted because she was unable to verbally contract for safety.   Patient was able to contract for safety while at behavioral health hospital. While Zoloft was recommended for treatment on admission, patient was uncomfortable with starting any medications at this time.  Patient is interested in attending therapy but not interested in medication management at this time.  She denies SI/HI/AVH throughout hospitalization. Patient's mother was contacted for collateral and will be regularly checking in on patient and denied any safety concerns.    Total Time spent with patient: 45 minutes  Musculoskeletal: Strength & Muscle Tone: within normal limits Gait & Station: normal Patient leans: N/A  Psychiatric Specialty Exam  Presentation  General Appearance: Casual   Eye Contact:Fair   Speech:Clear and Coherent   Speech Volume:Decreased   Handedness:Right    Mood and Affect  Mood:Anxious; Depressed   Duration of Depression Symptoms: No data recorded  Affect:Tearful    Thought Process  Thought Processes:Coherent   Descriptions of Associations:Intact   Orientation:Full (Time, Place and Person)   Thought Content:Logical   History of Schizophrenia/Schizoaffective disorder:No data recorded  Duration of Psychotic Symptoms:No data recorded  Hallucinations:Hallucinations: None  Ideas of Reference:None   Suicidal Thoughts:Suicidal Thoughts: No  Homicidal  Thoughts:Homicidal Thoughts: No   Sensorium  Memory:Immediate Fair; Remote Fair; Recent Fair   Judgment:Fair   Insight:Good    Executive Functions  Concentration:Fair   Attention Span:Good   Recall:Fair   Fund of Knowledge:Fair   Language:Fair    Psychomotor Activity  Psychomotor Activity:Psychomotor Activity: Normal   Assets  Assets:Communication Skills; Desire for Improvement; Housing    Sleep  Sleep:Sleep: Fair   Physical Exam: Physical Exam ROS Blood pressure 99/77, pulse (!) 104, temperature 97.7 F (36.5 C), resp. rate 18, height 5\' 8"  (1.727 m), weight 59.7 kg, SpO2 100 %, unknown if currently breastfeeding. Body mass index is 20.01 kg/m.  Mental Status Per Nursing Assessment::   On Admission:  NA  Demographic Factors:  NA  Loss Factors: NA  Historical Factors: NA  Risk Reduction Factors:   Responsible for children under 32 years of age, Sense of responsibility to family, Employed, Living with another person, especially a relative, Positive social support, and Positive coping skills or problem solving skills  Continued Clinical Symptoms:  Depression:   Anhedonia  Cognitive Features That Contribute To Risk:  None    Suicide Risk:  Mild:  There are no identifiable plans, no associated intent, mild dysphoria and related symptoms, good self-control (both objective and subjective assessment), few other risk factors, and identifiable protective factors, including available and accessible social support.   Follow-up Information     [facility] Follow up on 11/06/2022.   Why: You have a hospital follow up appointment on 11/06/22 at 1:30 pm to obtain therapy and medication management services.  This appointment will be Virtual via telehealth. Contact information: [address]  [address] [place] [address] [phone]                 Plan Of Care/Follow-up recommendations:  Activity: as tolerated  Diet:  heart  healthy  Other: -Follow-up with your outpatient psychiatric provider -instructions on appointment date, time, and address (location) are provided to you in discharge paperwork.  -Take your psychiatric medications as prescribed at discharge - instructions are provided to you in the discharge paperwork  -Follow-up with outpatient primary care doctor and other specialists -for management of chronic medical disease, including: microcytosis   -Recommend abstinence from alcohol, tobacco, and other illicit drug use at discharge.   -If your psychiatric symptoms recur, worsen, or if you have side effects to your psychiatric medications, call your outpatient psychiatric provider, 911, [phone] or go to the nearest emergency department.  -If suicidal thoughts recur, call your outpatient psychiatric provider, 911, [phone] or go to the nearest emergency department.   Park Pope, MD 10/31/2022, 1:02 PM

## 2022-10-31 NOTE — Progress Notes (Signed)
   10/31/22 0558  15 Minute Checks  Location Bedroom  Visual Appearance Calm  Behavior Sleeping  Sleep (Behavioral Health Patients Only)  Calculate sleep? (Click Yes once per 24 hr at 0600 safety check) Yes  Documented sleep last 24 hours 8.25

## 2022-11-01 LAB — PROLACTIN: Prolactin: 23.3 ng/mL (ref 4.8–33.4)

## 2023-12-06 ENCOUNTER — Encounter (HOSPITAL_COMMUNITY): Payer: Self-pay | Admitting: Emergency Medicine

## 2023-12-06 ENCOUNTER — Other Ambulatory Visit: Payer: Self-pay

## 2023-12-06 ENCOUNTER — Emergency Department (HOSPITAL_COMMUNITY)
Admission: EM | Admit: 2023-12-06 | Discharge: 2023-12-06 | Disposition: A | Discharge status: Home / Self Care | Attending: Emergency Medicine | Admitting: Emergency Medicine

## 2023-12-06 ENCOUNTER — Emergency Department (HOSPITAL_COMMUNITY)

## 2023-12-06 DIAGNOSIS — M79671 Pain in right foot: Secondary | ICD-10-CM | POA: Insufficient documentation

## 2023-12-06 DIAGNOSIS — M7989 Other specified soft tissue disorders: Secondary | ICD-10-CM | POA: Insufficient documentation

## 2023-12-06 DIAGNOSIS — W2201XA Walked into wall, initial encounter: Secondary | ICD-10-CM | POA: Insufficient documentation

## 2023-12-06 DIAGNOSIS — M25571 Pain in right ankle and joints of right foot: Secondary | ICD-10-CM | POA: Insufficient documentation

## 2023-12-06 NOTE — ED Provider Notes (Signed)
  EMERGENCY DEPARTMENT AT [facility] Provider Note   CSN: [id number] Arrival date & time: 12/06/23  0354     Patient presents with: Foot Pain   Melody Mcclure is a 33 y.o. female.  Patient presents to the emergency room complaining of right-sided foot pain.  She states there was a bug on her shoe and she kicked to try to get the bug off her shoe and accidentally kicked a wall.  This happened at 11 PM last night.  The patient complains of swelling along the top of the foot with some right-sided ankle tenderness.    Foot Pain       Prior to Admission medications   Medication Sig Start Date End Date Taking? Authorizing Provider  norethindrone -ethinyl estradiol -FE (LOESTRIN  FE) 1-20 MG-MCG tablet Take 1 tablet by mouth daily.    [provider]    Allergies: Dilaudid  [hydromorphone  hcl]    Review of Systems  Updated Vital Signs BP (!) 129/96 (BP Location: Right Arm)   Pulse 80   Temp 98.6 F (37 C) (Oral)   Resp 18   Ht 5' 7 (1.702 m)   Wt 66.2 kg   SpO2 100%   BMI 22.87 kg/m   Physical Exam Vitals and nursing note reviewed.  HENT:     Head: Normocephalic and atraumatic.  Eyes:     Pupils: Pupils are equal, round, and reactive to light.  Pulmonary:     Effort: Pulmonary effort is normal. No respiratory distress.  Musculoskeletal:        General: Swelling, tenderness and signs of injury present. No deformity.     Cervical back: Normal range of motion.     Comments: Patient able to move all toes on the right foot and able to move right ankle.  Patient with some swelling to the dorsal surface of the right foot.  Palpable pedal pulse.  No obvious deformity.  Skin:    General: Skin is dry.  Neurological:     Mental Status: She is alert.  Psychiatric:        Speech: Speech normal.        Behavior: Behavior normal.     (all labs ordered are listed, but only abnormal results are displayed) Labs Reviewed - No data to  display  EKG: None  Radiology: DG Foot Complete Right Result Date: 12/06/2023 EXAM: 3 or more VIEW(S) XRAY OF THE RIGHT FOOT 12/06/2023 04:26:37 AM COMPARISON: None available. CLINICAL HISTORY: Trauma to foot. Trying to kick off a bug on her shoe and accidentally kicked a wall. FINDINGS: BONES AND JOINTS: No acute fracture. No focal osseous lesion. No joint dislocation. SOFT TISSUES: The soft tissues are unremarkable. IMPRESSION: 1. No significant abnormality. Electronically signed by: Lonni Necessary MD 12/06/2023 04:43 AM EDT RP Workstation: [id number]     Procedures   Medications Ordered in the ED - No data to display                                  Medical Decision Making Amount and/or Complexity of Data Reviewed Radiology: ordered.   Patient presents to the emergency department with a chief complaint of foot pain.  Differential includes soft tissue injury, fracture, dislocation, others  I ordered and interpreted imaging including plain films of the right foot.  The x-rays showed no acute abnormalities, fractures, dislocations.  I agree with the radiologist findings.  Patient given instruction on  supportive care at home including ice packs and anti-inflammatory medication.  No indication for further emergent workup or admission.  Patient may follow-up with primary care or orthopedics if she experiences lingering pain.     Final diagnoses:  Foot pain, right    ED Discharge Orders     None          Logan Ubaldo KATHEE DEVONNA 12/06/23 0506    Jerrol Agent, MD 12/06/23 [phone]

## 2023-12-06 NOTE — Discharge Instructions (Addendum)
 You were seen this evening due to right-sided foot pain.  Your x-rays were reassuring with no fracture or dislocation noted.  I recommend using ice, ibuprofen  or Naprosyn, and Tylenol  for pain and inflammation relief at home.  If your symptoms do not subside on their own you may follow-up with orthopedics for further evaluation if needed.

## 2023-12-06 NOTE — ED Triage Notes (Signed)
 Pt reports that she thinks she broke her foot. Pt was trying to kick off a bug on her shoe and accidentally kicked a wall. This happened around 2300 last night.

## 2023-12-07 ENCOUNTER — Encounter: Payer: Self-pay | Admitting: Orthopedic Surgery

## 2023-12-07 ENCOUNTER — Other Ambulatory Visit: Payer: Self-pay | Admitting: Orthopedic Surgery

## 2023-12-07 DIAGNOSIS — M79671 Pain in right foot: Secondary | ICD-10-CM
